# Patient Record
Sex: Female | Born: 1965
Health system: Southern US, Community
[De-identification: ages and names within clinical notes are randomized; demographics above are authoritative.]

## PROBLEM LIST (undated history)

## (undated) DIAGNOSIS — L989 Disorder of the skin and subcutaneous tissue, unspecified: Secondary | ICD-10-CM

## (undated) DIAGNOSIS — K219 Gastro-esophageal reflux disease without esophagitis: Secondary | ICD-10-CM

## (undated) DIAGNOSIS — N951 Menopausal and female climacteric states: Secondary | ICD-10-CM

## (undated) DIAGNOSIS — N393 Stress incontinence (female) (male): Secondary | ICD-10-CM

## (undated) DIAGNOSIS — Z820 Family history of epilepsy and other diseases of the nervous system: Secondary | ICD-10-CM

## (undated) DIAGNOSIS — M7540 Impingement syndrome of unspecified shoulder: Secondary | ICD-10-CM

## (undated) DIAGNOSIS — M222X9 Patellofemoral disorders, unspecified knee: Secondary | ICD-10-CM

## (undated) DIAGNOSIS — F4320 Adjustment disorder, unspecified: Secondary | ICD-10-CM

## (undated) DIAGNOSIS — L309 Dermatitis, unspecified: Secondary | ICD-10-CM

## (undated) HISTORY — DX: Dermatitis, unspecified: L30.9

## (undated) HISTORY — DX: Patellofemoral disorders, unspecified knee: M22.2X9

## (undated) HISTORY — PX: NASAL SINUS SURGERY: SHX719

## (undated) HISTORY — PX: KNEE SURGERY: SHX244

## (undated) HISTORY — PX: DILATION AND CURETTAGE OF UTERUS: SHX78

## (undated) HISTORY — DX: Adjustment disorder, unspecified: F43.20

## (undated) HISTORY — DX: Stress incontinence (female) (male): N39.3

## (undated) HISTORY — DX: Menopausal and female climacteric states: N95.1

## (undated) HISTORY — DX: Disorder of the skin and subcutaneous tissue, unspecified: L98.9

## (undated) HISTORY — DX: Impingement syndrome of unspecified shoulder: M75.40

## (undated) HISTORY — DX: Gastro-esophageal reflux disease without esophagitis: K21.9

## (undated) HISTORY — DX: Family history of epilepsy and other diseases of the nervous system: Z82.0

---

## 2002-08-11 DIAGNOSIS — M7540 Impingement syndrome of unspecified shoulder: Secondary | ICD-10-CM | POA: Insufficient documentation

## 2002-08-11 HISTORY — DX: Impingement syndrome of unspecified shoulder: M75.40

## 2003-09-28 DIAGNOSIS — L989 Disorder of the skin and subcutaneous tissue, unspecified: Secondary | ICD-10-CM

## 2003-09-28 HISTORY — DX: Disorder of the skin and subcutaneous tissue, unspecified: L98.9

## 2005-03-23 ENCOUNTER — Ambulatory Visit: Payer: Self-pay | Admitting: Family Medicine

## 2005-05-08 HISTORY — PX: ABDOMINAL HYSTERECTOMY: SHX81

## 2005-11-07 ENCOUNTER — Ambulatory Visit: Payer: Self-pay | Admitting: Unknown Physician Specialty

## 2006-01-04 ENCOUNTER — Inpatient Hospital Stay: Payer: Self-pay | Admitting: Otolaryngology

## 2006-01-18 ENCOUNTER — Ambulatory Visit: Payer: Self-pay | Admitting: Otolaryngology

## 2007-08-10 IMAGING — CT CT MAXILLOFACIAL WITHOUT CONTRAST
1 of 2 series · 15 of 30 positions shown, 19 images · non-contrast
Comparison: none

REASON FOR EXAM: Sinusitis brain abscess  Call  pager  [phone] or [phone]
OR
COMMENTS:

[Series 3: (person_name) series 1mm · axial · 0.29mm/px · z∈[+799,+951]mm · 15 of 168 slices shown, 19 images]
[im 8/168  brain]
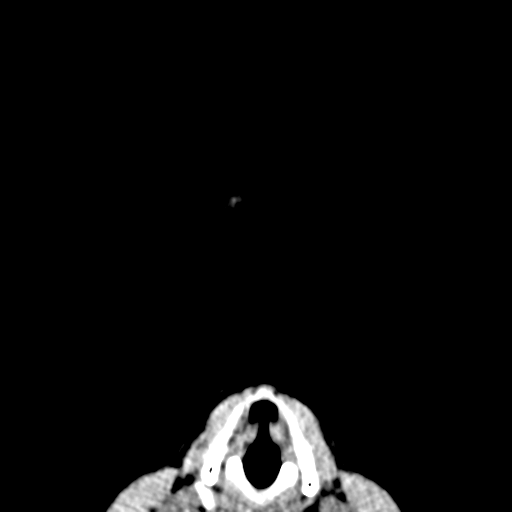
[im 8/168  bone]
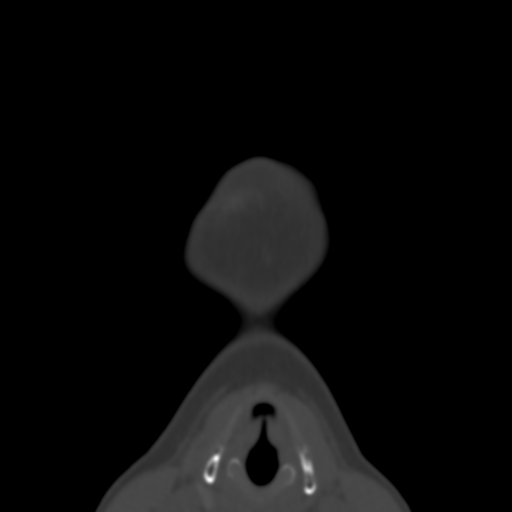
[im 23/168  bone]
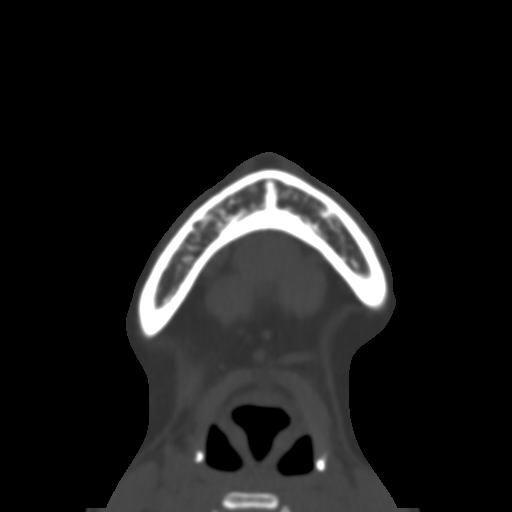
[im 31/168  bone]
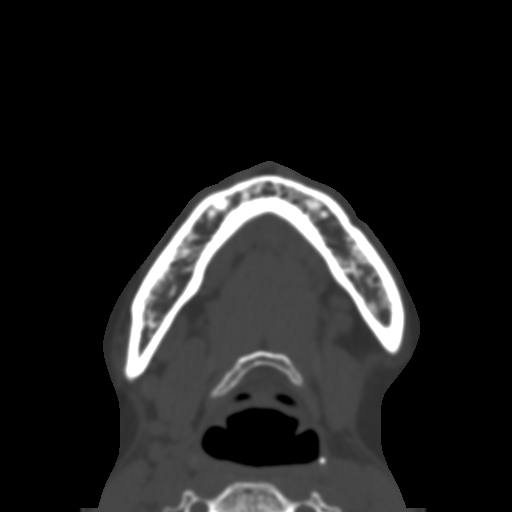
[im 38/168  bone]
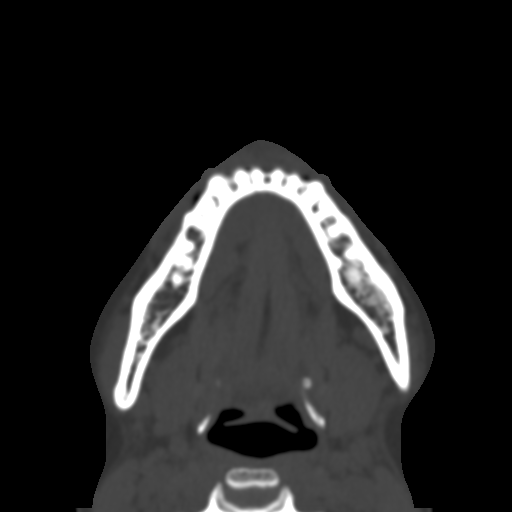
[im 54/168  brain]
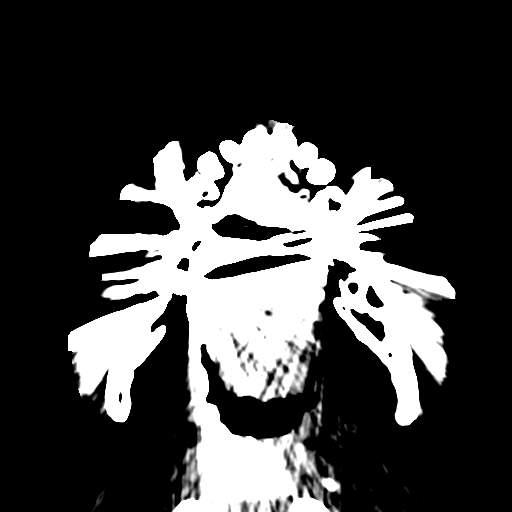
[im 54/168  bone]
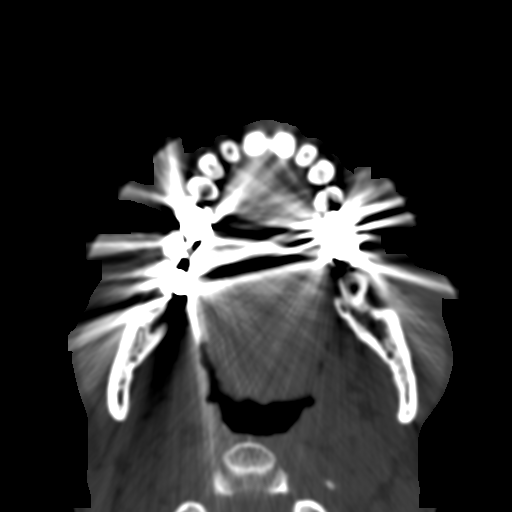
[im 61/168  bone]
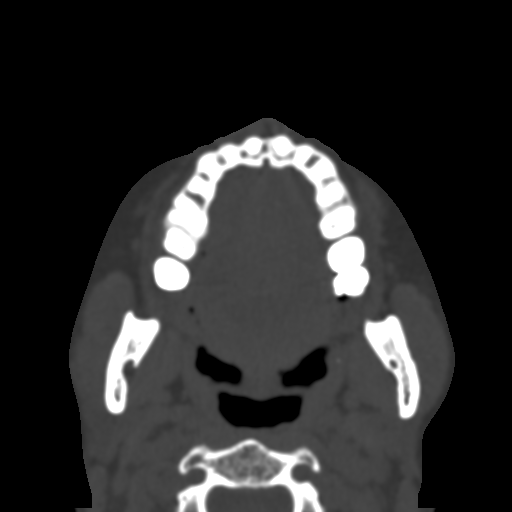
[im 76/168  bone]
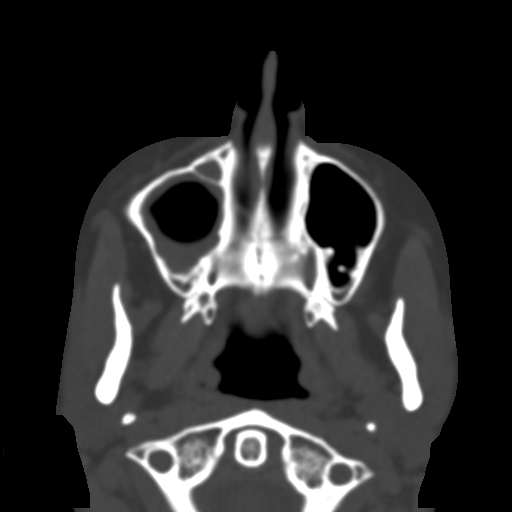
[im 84/168  bone]
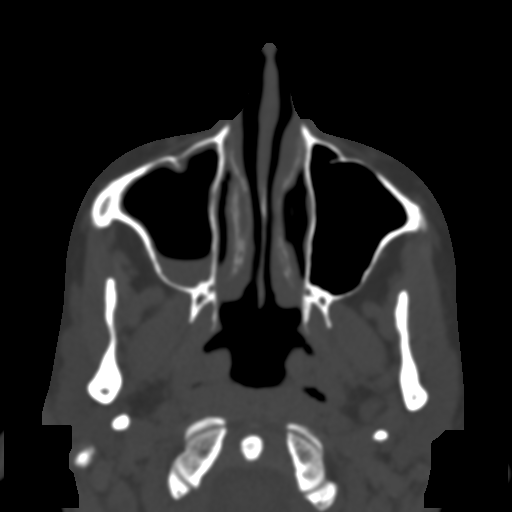
[im 92/168  brain]
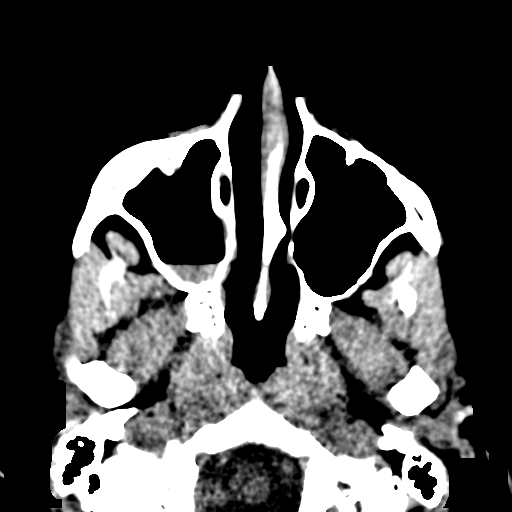
[im 92/168  bone]
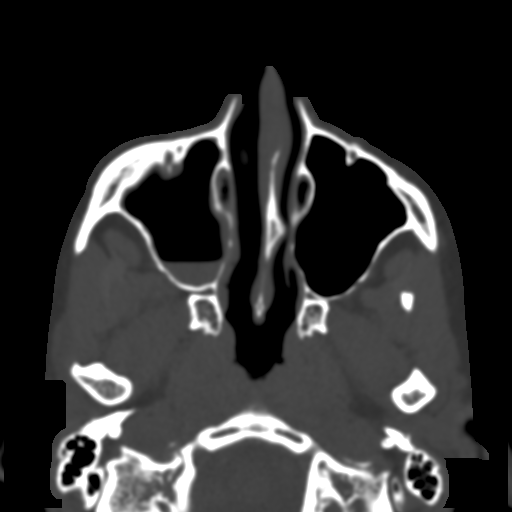
[im 107/168  bone]
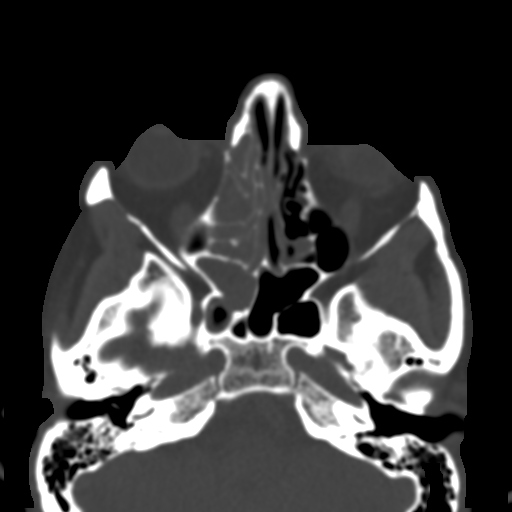
[im 114/168  bone]
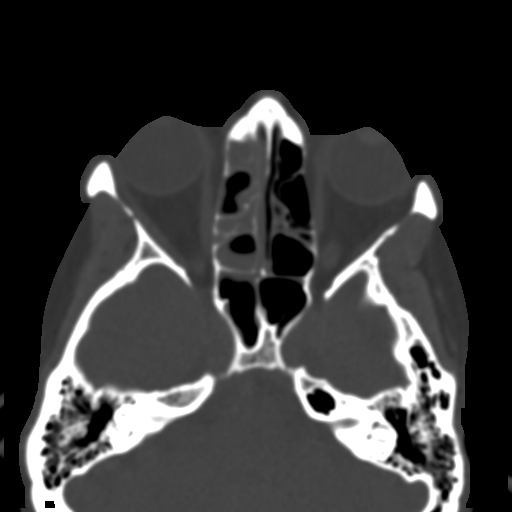
[im 130/168  bone]
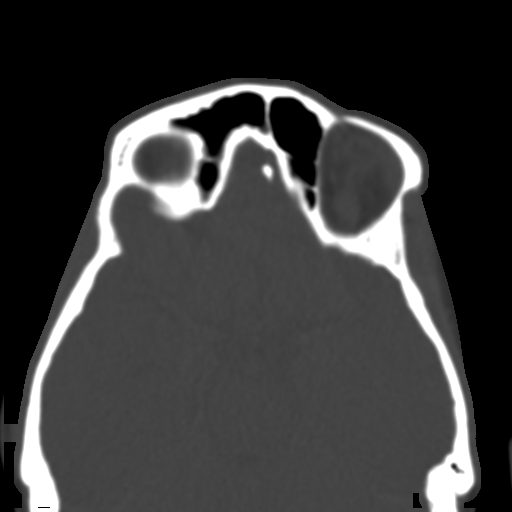
[im 137/168  brain]
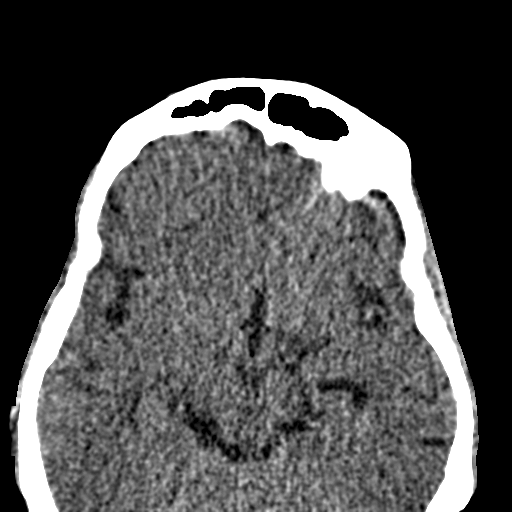
[im 137/168  bone]
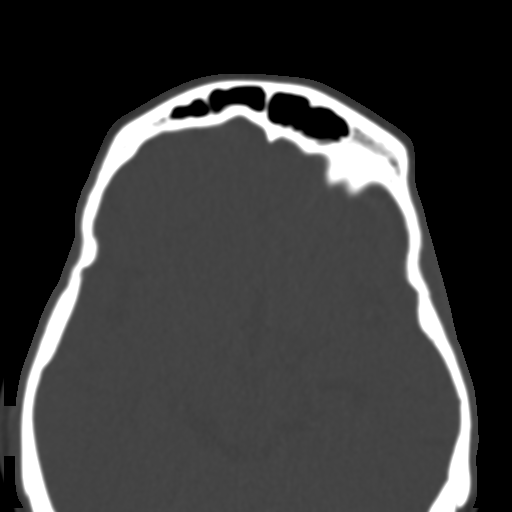
[im 145/168  bone]
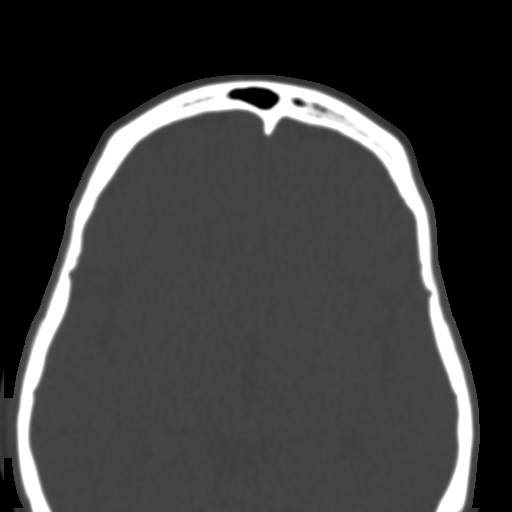
[im 160/168  bone]
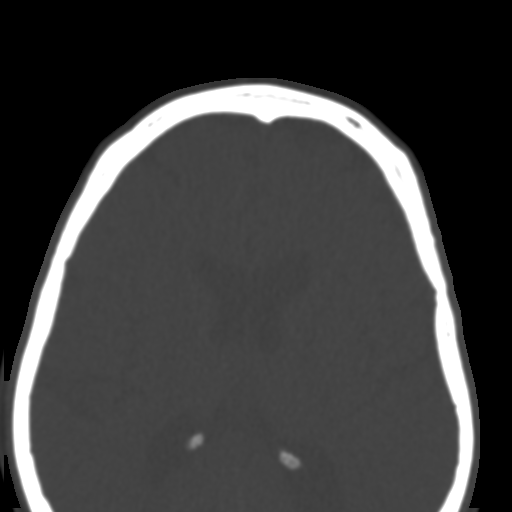

[15 of 30 positions shown; findings below may reference images not displayed]

PROCEDURE:     CT  - CT MAXILLOFACIAL (LONCONAO)  - January 04, 2006  [DATE]

RESULT:         An air-fluid level is demonstrated within the RIGHT
maxillary sinus as well as an element of mucoperiosteal thickening.  There
is near complete opacification of the ethmoid sinuses and near complete or
partial opacification of the sphenoid sinus.  These findings are appreciated
on the RIGHT.  The osseous structures appear to be intact without CT
evidence of erosions.  There is evidence of mucoperiosteal thickening within
the frontal sinuses on the RIGHT.  Occlusion of the ostia of the maxillary
sinus on the RIGHT is appreciated.  The mastoid air cells are patent.  Mild
mucoperiosteal thickening is identified involving the ethmoid air cells on
the LEFT.
IMPRESSION: Findings consistent with pansinusitis primarily on the RIGHT as described
above.  As stated above, the osseous structures appear to be intact.

## 2007-08-10 IMAGING — CT CT PARANASAL SINUSES W/O CM
1 series · 16 of 24 positions shown, 20 images · non-contrast
Comparison: none

REASON FOR EXAM: Sinusitis, brain abscess                            Call
report to: Pager# [phone] or [phone] OR
COMMENTS:

[Series 2: sinus · axial · 0.27mm/px · z∈[+833,+938]mm · 16 of 24 slices shown, 20 images]
[im 2/24  brain]
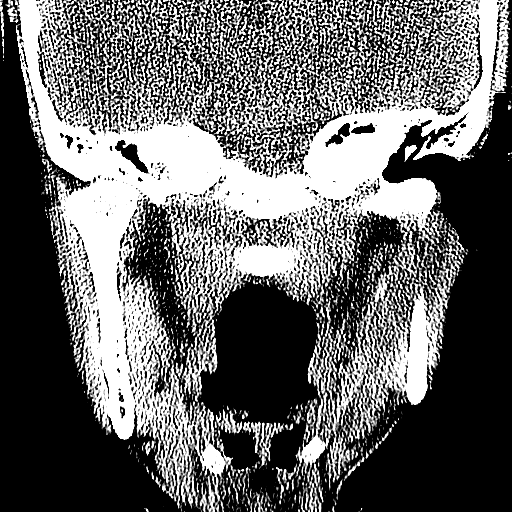
[im 2/24  bone]
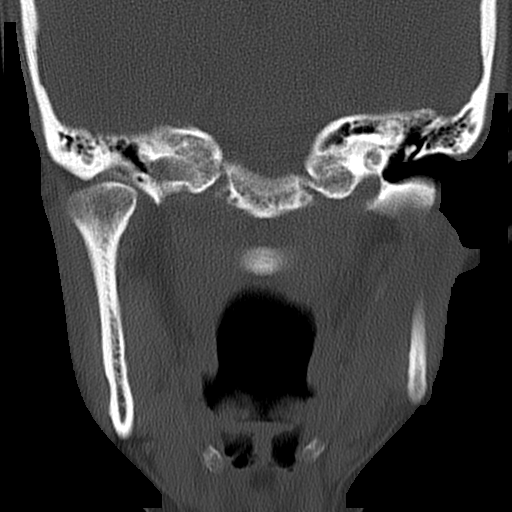
[im 4/24  bone]
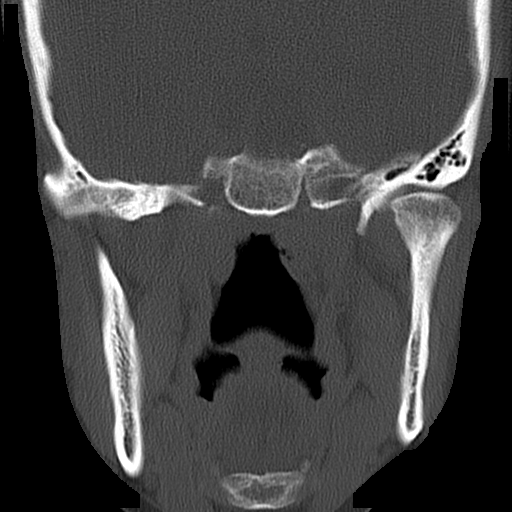
[im 5/24  bone]
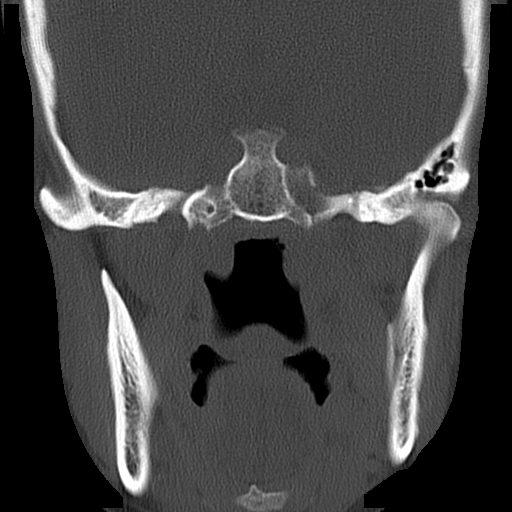
[im 6/24  bone]
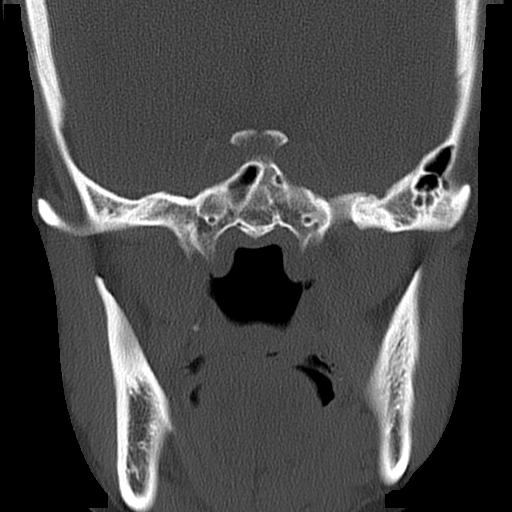
[im 8/24  brain]
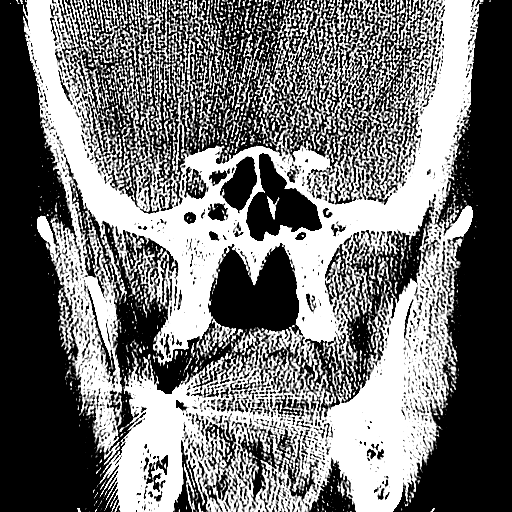
[im 8/24  bone]
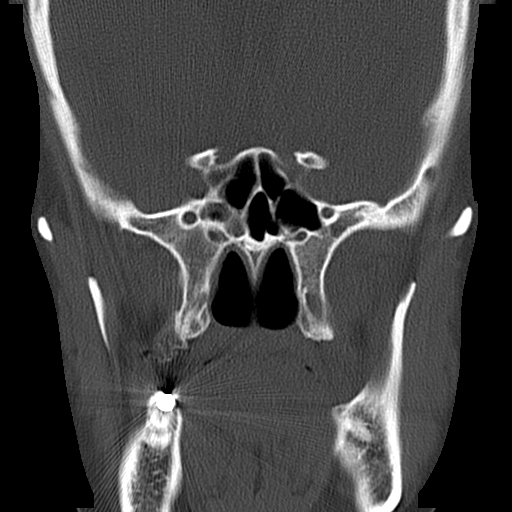
[im 9/24  bone]
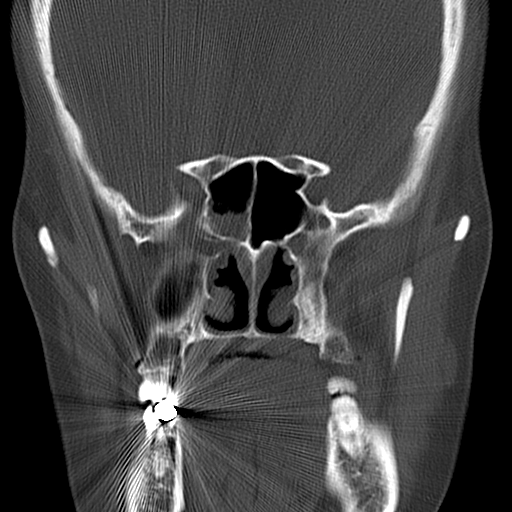
[im 10/24  bone]
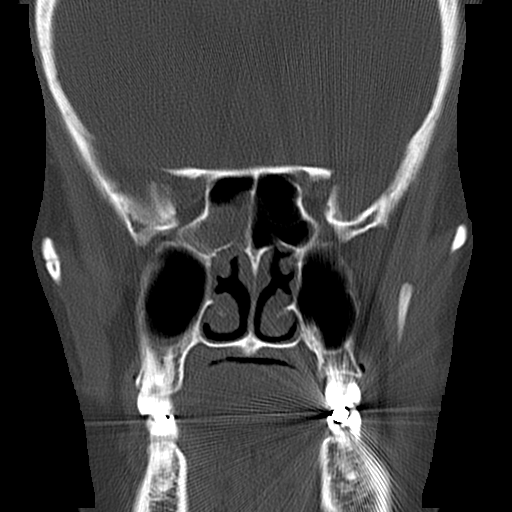
[im 12/24  bone]
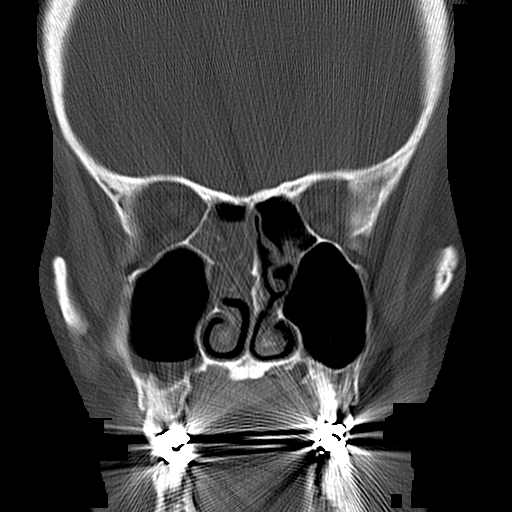
[im 13/24  brain]
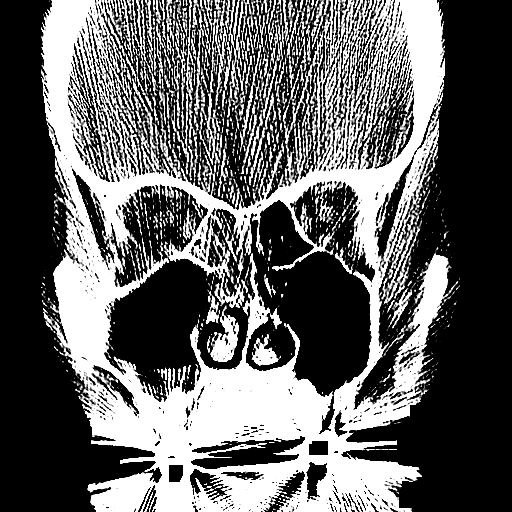
[im 13/24  bone]
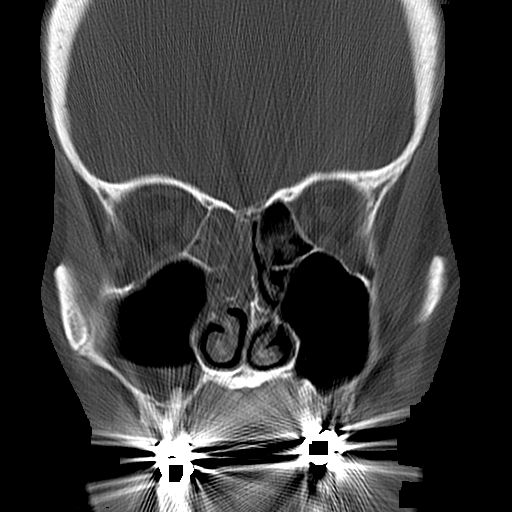
[im 15/24  bone]
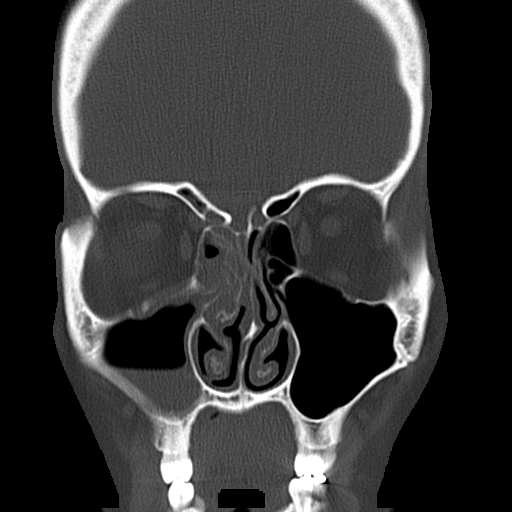
[im 16/24  bone]
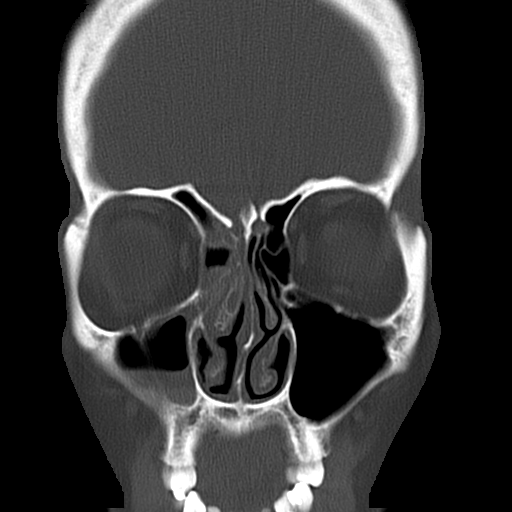
[im 17/24  bone]
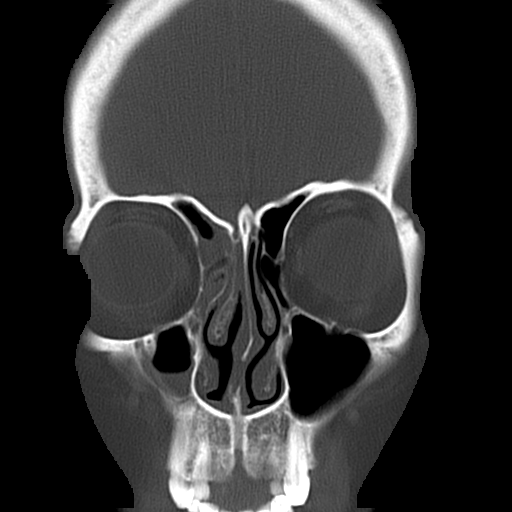
[im 19/24  brain]
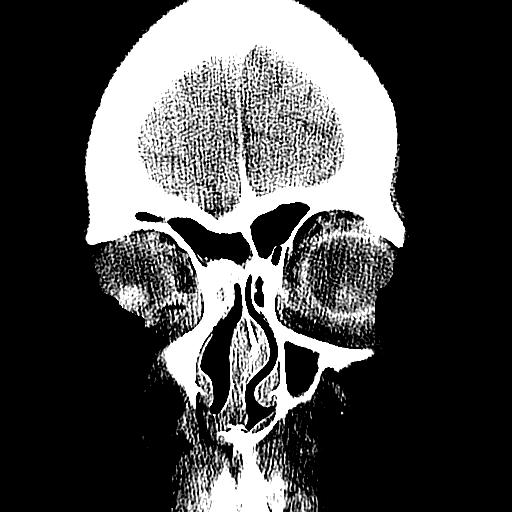
[im 19/24  bone]
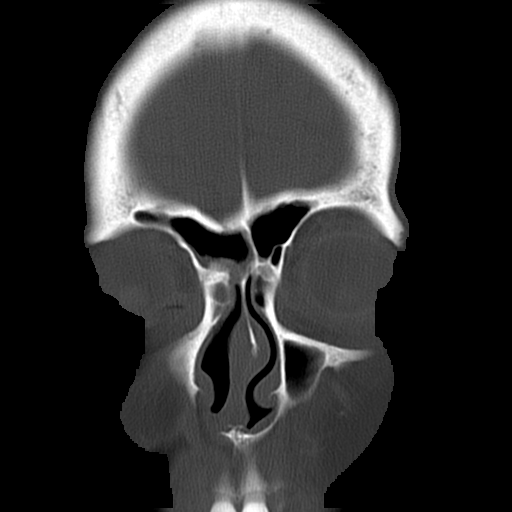
[im 20/24  bone]
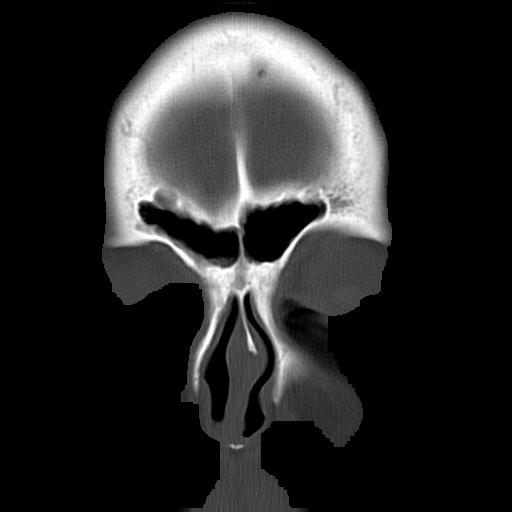
[im 21/24  bone]
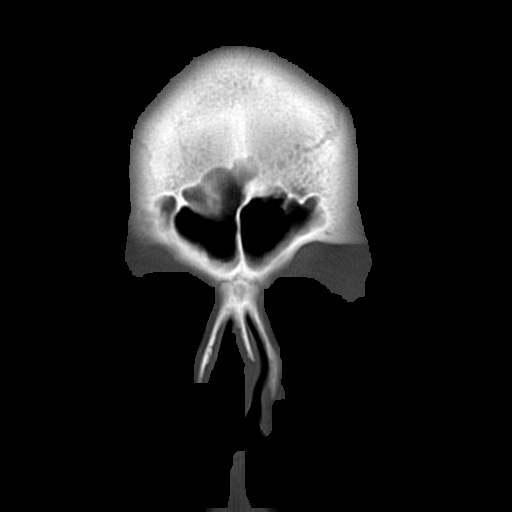
[im 23/24  bone]
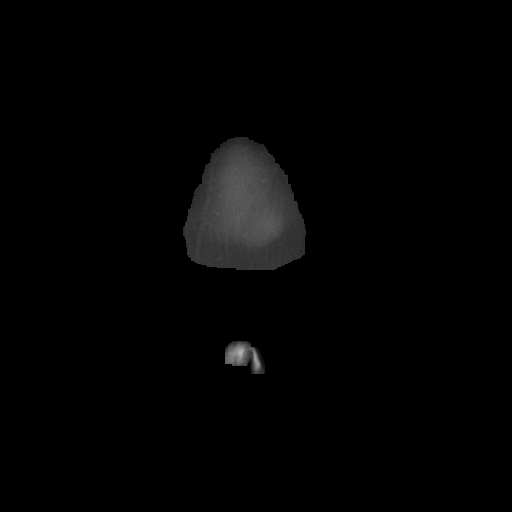

[16 of 24 positions shown; findings below may reference images not displayed]

PROCEDURE:     CT  - CT SINUSES WITHOUT CONTRAST  - January 04, 2006  [DATE]

RESULT:     Coronal images were obtained through the paranasal sinuses.

An air/fluid level as well as mucoperiosteal thickening is identified within
the RIGHT maxillary sinus. There is opacification of the ethmoid air cells
as well as mucoperiosteal thickening within the RIGHT frontal sinus. An
air/fluid level with mucoperiosteal thickening and near complete
opacification is demonstrated within the sphenoid sinuses on the RIGHT.
There is occlusion of the ostia of the maxillary sinus on the RIGHT.
Evaluation on the LEFT demonstrates an element of mucoperiosteal thickening
involving the sphenoid sinus. The remaining paranasal sinuses appear to be
well aerated.
IMPRESSION: Findings consistent with pansinusitis primarily on the
RIGHT as described above.

## 2007-10-01 DIAGNOSIS — D239 Other benign neoplasm of skin, unspecified: Secondary | ICD-10-CM

## 2007-10-01 HISTORY — DX: Other benign neoplasm of skin, unspecified: D23.9

## 2007-10-04 LAB — HM PAP SMEAR: HM PAP: NEGATIVE

## 2008-04-09 ENCOUNTER — Ambulatory Visit: Payer: Self-pay | Admitting: Family Medicine

## 2008-04-10 ENCOUNTER — Ambulatory Visit: Payer: Self-pay | Admitting: Family Medicine

## 2009-01-05 DIAGNOSIS — N393 Stress incontinence (female) (male): Secondary | ICD-10-CM

## 2009-01-05 HISTORY — DX: Stress incontinence (female) (male): N39.3

## 2010-04-25 DIAGNOSIS — M222X9 Patellofemoral disorders, unspecified knee: Secondary | ICD-10-CM

## 2010-04-25 HISTORY — DX: Patellofemoral disorders, unspecified knee: M22.2X9

## 2011-01-06 ENCOUNTER — Ambulatory Visit: Payer: Self-pay | Admitting: Unknown Physician Specialty

## 2011-01-06 LAB — HM COLONOSCOPY

## 2011-04-20 ENCOUNTER — Ambulatory Visit: Payer: Self-pay | Admitting: Specialist

## 2011-05-03 ENCOUNTER — Ambulatory Visit: Payer: Self-pay | Admitting: Specialist

## 2011-07-26 ENCOUNTER — Encounter: Payer: Self-pay | Admitting: Specialist

## 2011-08-07 ENCOUNTER — Encounter: Payer: Self-pay | Admitting: Specialist

## 2011-09-06 ENCOUNTER — Encounter: Payer: Self-pay | Admitting: Specialist

## 2012-11-29 ENCOUNTER — Ambulatory Visit: Payer: Self-pay | Admitting: Family Medicine

## 2012-11-29 LAB — HM MAMMOGRAPHY

## 2015-03-15 ENCOUNTER — Other Ambulatory Visit: Payer: Self-pay

## 2015-03-15 DIAGNOSIS — N951 Menopausal and female climacteric states: Secondary | ICD-10-CM | POA: Insufficient documentation

## 2015-03-15 DIAGNOSIS — K219 Gastro-esophageal reflux disease without esophagitis: Secondary | ICD-10-CM

## 2015-03-15 DIAGNOSIS — L309 Dermatitis, unspecified: Secondary | ICD-10-CM

## 2015-03-15 DIAGNOSIS — Z820 Family history of epilepsy and other diseases of the nervous system: Secondary | ICD-10-CM

## 2015-03-15 DIAGNOSIS — F4320 Adjustment disorder, unspecified: Secondary | ICD-10-CM | POA: Insufficient documentation

## 2015-03-15 DIAGNOSIS — F419 Anxiety disorder, unspecified: Secondary | ICD-10-CM | POA: Insufficient documentation

## 2015-03-15 DIAGNOSIS — F4321 Adjustment disorder with depressed mood: Secondary | ICD-10-CM

## 2015-03-15 HISTORY — DX: Dermatitis, unspecified: L30.9

## 2015-03-15 HISTORY — DX: Adjustment disorder, unspecified: F43.20

## 2015-03-15 HISTORY — DX: Gastro-esophageal reflux disease without esophagitis: K21.9

## 2015-03-15 HISTORY — DX: Family history of epilepsy and other diseases of the nervous system: Z82.0

## 2015-03-15 HISTORY — DX: Menopausal and female climacteric states: N95.1

## 2015-03-15 MED ORDER — SERTRALINE HCL 100 MG PO TABS: 100.0000 mg | ORAL_TABLET | Freq: Every day | ORAL | Status: DC

## 2015-03-15 NOTE — Telephone Encounter (Signed)
Patient requesting refill of 90 day supply.

## 2015-03-16 ENCOUNTER — Other Ambulatory Visit: Payer: Self-pay

## 2015-03-16 DIAGNOSIS — F4321 Adjustment disorder with depressed mood: Secondary | ICD-10-CM

## 2015-03-16 MED ORDER — ZOLOFT 100 MG PO TABS: 100.0000 mg | ORAL_TABLET | Freq: Every day | ORAL | Status: DC

## 2015-04-23 ENCOUNTER — Ambulatory Visit (INDEPENDENT_AMBULATORY_CARE_PROVIDER_SITE_OTHER): Payer: 59 | Admitting: Family Medicine

## 2015-04-23 ENCOUNTER — Encounter: Payer: Self-pay | Admitting: Family Medicine

## 2015-04-23 VITALS — BP 100/66 | HR 72 | Temp 97.7°F | Resp 16 | Ht 65.0 in | Wt 126.0 lb

## 2015-04-23 DIAGNOSIS — E785 Hyperlipidemia, unspecified: Secondary | ICD-10-CM | POA: Diagnosis not present

## 2015-04-23 DIAGNOSIS — Z0000 Encounter for general adult medical examination without abnormal findings: Secondary | ICD-10-CM

## 2015-04-23 DIAGNOSIS — Z23 Encounter for immunization: Secondary | ICD-10-CM

## 2015-04-23 DIAGNOSIS — N393 Stress incontinence (female) (male): Secondary | ICD-10-CM | POA: Diagnosis not present

## 2015-04-23 DIAGNOSIS — Z1239 Encounter for other screening for malignant neoplasm of breast: Secondary | ICD-10-CM | POA: Diagnosis not present

## 2015-04-23 DIAGNOSIS — F4329 Adjustment disorder with other symptoms: Secondary | ICD-10-CM | POA: Diagnosis not present

## 2015-04-23 LAB — POCT URINALYSIS DIPSTICK
Bilirubin, UA: NEGATIVE
Glucose, UA: NEGATIVE
Ketones, UA: NEGATIVE
Leukocytes, UA: NEGATIVE
Nitrite, UA: NEGATIVE
PROTEIN UA: NEGATIVE
RBC UA: NEGATIVE
SPEC GRAV UA: 1.01
UROBILINOGEN UA: 0.2
pH, UA: 6

## 2015-04-23 NOTE — Progress Notes (Signed)
Patient ID: Alyssa Humphrey, female   DOB: [birth year], 49 y.o.   MRN: [id number]       Patient: Alyssa Humphrey, Female    DOB: March 31, 1966, 49 y.o.   MRN: [id number] Visit Date: 04/23/2015  Today's Provider: Margarita Rana, MD   Chief Complaint  Patient presents with  . Annual Exam   Subjective:    Annual physical exam Alyssa Humphrey is a 49 y.o. female who presents today for health maintenance and complete physical. She feels well. She reports exercising once a week, but stays very active with daily activites. She reports she is sleeping fairly well. Has no concerns today except for worsening urinary incontinence.   05/14/12 CPE 10/04/07 Pap-neg 11/28/12 Mammo-BI-RADS 2 01/06/11 Colon-WNL   -----------------------------------------------------------------   Review of Systems  Constitutional: Positive for fatigue.  HENT: Positive for sore throat.   Eyes: Negative.   Respiratory: Negative.   Cardiovascular: Negative.   Gastrointestinal: Negative.   Endocrine: Negative.   Genitourinary: Positive for enuresis.  Musculoskeletal: Negative.   Skin: Negative.   Allergic/Immunologic: Negative.   Neurological: Positive for headaches.  Hematological: Negative.   Psychiatric/Behavioral: Negative.     Social History      She  reports that she has never smoked. She has never used smokeless tobacco. She reports that she drinks about 1.2 oz of alcohol per week. She reports that she does not use illicit drugs.       Social History   Social History  . Marital Status: Married    Spouse Name: N/A  . Number of Children: N/A  . Years of Education: N/A   Social History Main Topics  . Smoking status: Never Smoker   . Smokeless tobacco: Never Used  . Alcohol Use: 1.2 oz/week    2 Glasses of wine per week  . Drug Use: No  . Sexual Activity: Not Asked   Other Topics Concern  . None   Social History Narrative    Patient Active Problem List   Diagnosis Date Noted  .  Adaptation reaction 03/15/2015  . Dermatitis, eczematoid 03/15/2015  . Family history of Alzheimer's disease 03/15/2015  . Acid reflux 03/15/2015  . Post menopausal syndrome 03/15/2015  . Patella-femoral syndrome 04/25/2010  . Stress incontinence 01/05/2009  . Skin lesion 09/28/2003  . Impingement syndrome of shoulder 08/11/2002    Past Surgical History  Procedure Laterality Date  . Cesarean section  04/26/92; 01/15/96; 12/18/99  . Nasal sinus surgery    . Knee surgery Left   . Dilation and curettage of uterus      x's 2  . Abdominal hysterectomy  2007    Family History        Family Status  Relation Status Death Age  . Mother Alive   . Father Deceased 69  . Sister Alive   . Sister Alive         Her family history includes Dementia in her mother; Healthy in her sister and sister; Hypertension in her father and mother; Osteoarthritis in her father and mother.    No Known Allergies  Previous Medications   ESTROGENS, CONJUGATED, (PREMARIN) 1.25 MG TABLET    Take 2 tablets by mouth daily.   LOTEMAX 0.5 % GEL       PHOSPHATIDYLSERINE-DHA-EPA (VAYACOG) 100-19.5-6.5 MG CAPS    Take 1 capsule by mouth daily.   ZOLOFT 100 MG TABLET    Take 1 tablet (100 mg total) by mouth daily.    Patient Care Team: Izora Gala  Venia Minks, MD as PCP - General (Family Medicine)     Objective:   Vitals: BP 100/66 mmHg  Pulse 72  Temp(Src) 97.7 F (36.5 C) (Oral)  Resp 16  Ht 5\' 5"  (1.651 m)  Wt 126 lb (57.153 kg)  BMI 20.97 kg/m2  SpO2 98%   Physical Exam  Constitutional: She is oriented to person, place, and time. She appears well-developed and well-nourished.  HENT:  Head: Normocephalic and atraumatic.  Right Ear: Tympanic membrane, external ear and ear canal normal.  Left Ear: Tympanic membrane, external ear and ear canal normal.  Nose: Nose normal.  Mouth/Throat: Uvula is midline, oropharynx is clear and moist and mucous membranes are normal.  Eyes: Conjunctivae, EOM and lids are  normal. Pupils are equal, round, and reactive to light.  Neck: Trachea normal and normal range of motion. Neck supple. Carotid bruit is not present. No thyroid mass and no thyromegaly present.  Cardiovascular: Normal rate, regular rhythm and normal heart sounds.   Pulmonary/Chest: Effort normal and breath sounds normal.  Abdominal: Soft. Normal appearance and bowel sounds are normal. There is no hepatosplenomegaly. There is no tenderness.  Genitourinary: No breast swelling, tenderness or discharge.  Musculoskeletal: Normal range of motion.  Lymphadenopathy:    She has no cervical adenopathy.    She has no axillary adenopathy.  Neurological: She is alert and oriented to person, place, and time. She has normal strength. No cranial nerve deficit.  Skin: Skin is warm, dry and intact.  Psychiatric: She has a normal mood and affect. Her speech is normal and behavior is normal. Judgment and thought content normal. Cognition and memory are normal.    Assessment & Plan:     Routine Health Maintenance and Physical Exam  Exercise Activities and Dietary recommendations Goals    . Exercise 150 minutes per week (moderate activity)       Immunization History  Administered Date(s) Administered  . Influenza,inj,Quad PF,36+ Mos 04/23/2015   1. Annual physical exam Stable.   - POCT urinalysis dipstick Results for orders placed or performed in visit on 04/23/15  POCT urinalysis dipstick  Result Value Ref Range   Color, UA yellow    Clarity, UA clear    Glucose, UA neg    Bilirubin, UA neg    Ketones, UA neg    Spec Grav, UA 1.010    Blood, UA neg    pH, UA 6.0    Protein, UA neg    Urobilinogen, UA 0.2    Nitrite, UA neg    Leukocytes, UA Negative Negative    2. Need for influenza vaccination Given today.  - Flu Vaccine QUAD 36+ mos IM  3. Breast cancer screening Will call and schedule.  - MM DIGITAL SCREENING BILATERAL; Future  4. Hyperlipidemia Stable. Will check labs.   -  CBC with Differential/Platelet - Comprehensive metabolic panel - Lipid Panel With LDL/HDL Ratio  5. Adjustment disorder with other symptom Stable. Continue current medication.   - TSH  6. Stress incontinence Will refer.  - Ambulatory referral to Urology   Patient was seen and examined by Jerrell Belfast, MD, and note scribed by Lynford Humphrey, [place].   I have reviewed the document for accuracy and completeness and I agree with above. Jerrell Belfast, MD   Margarita Rana, MD    --------------------------------------------------------------------

## 2015-04-23 NOTE — Patient Instructions (Signed)
Please call the [facility] at [facility] to schedule this at [phone]   

## 2015-05-26 ENCOUNTER — Ambulatory Visit (INDEPENDENT_AMBULATORY_CARE_PROVIDER_SITE_OTHER): Payer: 59 | Admitting: Urology

## 2015-05-26 ENCOUNTER — Encounter: Payer: Self-pay | Admitting: Urology

## 2015-05-26 VITALS — BP 99/64 | HR 55 | Ht 65.0 in | Wt 126.6 lb

## 2015-05-26 DIAGNOSIS — N393 Stress incontinence (female) (male): Secondary | ICD-10-CM

## 2015-05-26 LAB — BLADDER SCAN AMB NON-IMAGING: Scan Result: 82

## 2015-05-26 LAB — URINALYSIS, COMPLETE
Bilirubin, UA: NEGATIVE
GLUCOSE, UA: NEGATIVE
KETONES UA: NEGATIVE
LEUKOCYTES UA: NEGATIVE
Nitrite, UA: NEGATIVE
Protein, UA: NEGATIVE
RBC, UA: NEGATIVE
SPEC GRAV UA: 1.02 (ref 1.005–1.030)
Urobilinogen, Ur: 0.2 mg/dL (ref 0.2–1.0)
pH, UA: 7 (ref 5.0–7.5)

## 2015-05-26 LAB — MICROSCOPIC EXAMINATION
Bacteria, UA: NONE SEEN
Epithelial Cells (non renal): NONE SEEN /hpf (ref 0–10)
RBC MICROSCOPIC, UA: NONE SEEN /HPF (ref 0–?)
WBC, UA: NONE SEEN /hpf (ref 0–?)

## 2015-05-26 NOTE — Progress Notes (Signed)
Bladder Scan Patient void: 82 ml Performed By: Larna Daughters

## 2015-05-26 NOTE — Progress Notes (Signed)
05/26/2015 1:45 PM   Alyssa Humphrey 06/16/1965 [id number]  Referring provider: Margarita Rana, MD [address] [place], [place] [address]  Chief Complaint  Patient presents with  . New Patient (Initial Visit)    stress incontinence     HPI: 50 year old female who presents today for evaluation of urinary incontinence.  She has issues with leaking primarily when she laughs, coughs, sneezes. This is being going on for at least 5+.  It is becoming quite bothersome to her and affecting her quality of life. She has had to limit her physical activity due to concern for wetting herself.  She wears  2 ppd but not saturated depending on activity level.    She denies any other urinary symptoms including recurrent urinary tract infections, dysuria, gross hematuria, urinary urgency, frequency, or urge incontinence.  She has seen Dr. Gaynelle Arabian back in 2010 for the same issue at which time she underwent physical therapy. She does do Kegel exercises on a regular basis. She is not overweight.  She is status post laparoscopic abdominal hysterectomy.  She's had a C-section 2.  She denies any dyspareunia, vaginal bulging, or any other symptoms related to pelvic organ prolapse.  She denies any constipation or difficulty evacuating her stool.   PMH: Past Medical History  Diagnosis Date  . Acid reflux 03/15/2015  . Dermatitis, eczematoid 03/15/2015    hand   . Patella-femoral syndrome 04/25/2010    treated by Dr Cipriano Mile   . Skin lesion 09/28/2003    Lesion on right chin removed by Dr Debe Coder Clarcke-Compound nevus   . Adaptation reaction 03/15/2015  . Family history of Alzheimer's disease 03/15/2015  . Impingement syndrome of shoulder 08/11/2002    Injected per Ortho   . Post menopausal syndrome 03/15/2015  . Stress incontinence 01/05/2009    Treated by Dr. Arlyn Leak     Surgical History: Past Surgical History  Procedure Laterality Date  . Cesarean section  04/26/92; 01/15/96;  12/18/99  . Nasal sinus surgery    . Knee surgery Left   . Dilation and curettage of uterus      x's 2  . Abdominal hysterectomy  2007    Home Medications:    Medication List       This list is accurate as of: 05/26/15  1:45 PM.  Always use your most recent med list.               LOTEMAX 0.5 % Gel  Generic drug:  Loteprednol Etabonate     PREMARIN 1.25 MG tablet  Generic drug:  estrogens (conjugated)  Take 2 tablets by mouth daily. Reported on 05/26/2015     VAYACOG 100-19.5-6.5 MG Caps  Generic drug:  Phosphatidylserine-DHA-EPA  Take 1 capsule by mouth daily.     ZOLOFT 100 MG tablet  Generic drug:  sertraline  Take 1 tablet (100 mg total) by mouth daily.        Allergies: No Known Allergies  Family History: Family History  Problem Relation Age of Onset  . Dementia Mother   . Osteoarthritis Mother   . Hypertension Mother   . Hypertension Father   . Osteoarthritis Father   . Healthy Sister   . Healthy Sister   . Kidney cancer Neg Hx     Social History:  reports that she has never smoked. She has never used smokeless tobacco. She reports that she drinks about 1.2 oz of alcohol per week. She reports that she does not use  illicit drugs.  ROS: UROLOGY Frequent Urination?: No Hard to postpone urination?: Yes Burning/pain with urination?: No Get up at night to urinate?: No Leakage of urine?: Yes Urine stream starts and stops?: Yes Trouble starting stream?: No Do you have to strain to urinate?: No Blood in urine?: No Urinary tract infection?: No Sexually transmitted disease?: No Injury to kidneys or bladder?: No Painful intercourse?: No Weak stream?: No Currently pregnant?: No Vaginal bleeding?: No Last menstrual period?: hysterectomy  Gastrointestinal Nausea?: No Vomiting?: No Indigestion/heartburn?: No Diarrhea?: No Constipation?: No  Constitutional Fever: No Night sweats?: No Weight loss?: No Fatigue?: Yes  Skin Skin rash/lesions?:  No Itching?: No  Eyes Blurred vision?: No Double vision?: No  Ears/Nose/Throat Sore throat?: No Sinus problems?: No  Hematologic/Lymphatic Swollen glands?: No Easy bruising?: No  Cardiovascular Leg swelling?: No Chest pain?: No  Respiratory Cough?: No Shortness of breath?: No  Endocrine Excessive thirst?: No  Musculoskeletal Back pain?: Yes Joint pain?: No  Neurological Headaches?: No Dizziness?: No  Psychologic Depression?: No Anxiety?: No  Physical Exam: BP 99/64 mmHg  Pulse 55  Ht 5\' 5"  (1.651 m)  Wt 126 lb 9.6 oz (57.425 kg)  BMI 21.07 kg/m2  Constitutional:  Alert and oriented, No acute distress. HEENT: [place] AT, moist mucus membranes.  Trachea midline, no masses. Cardiovascular: No clubbing, cyanosis, or edema. Respiratory: Normal respiratory effort, no increased work of breathing. Skin: No rashes, bruises or suspicious lesions. Neurologic: Grossly intact, no focal deficits, moving all 4 extremities. Psychiatric: Normal mood and affect.  Urinalysis UA reviewed, negative.  See epic.  Pertinent Imaging: Results for orders placed or performed in visit on 05/26/15  Bladder Scan (Post Void Residual) in office  Result Value Ref Range   Scan Result 82     Assessment & Plan: 50 year old female with isolated distress urinary incontinence. This is severely impacting the quality of her life.  Her symptoms are refractory to pelvic floor therapy.    1. Stress incontinence  We discussed various options day in terms of treatment for her refractory stress incontinence. She is not obese and therefore this is not contributing to her symptoms. She does perform pelvic floor exercises on a regular basis and has been taught by physical therapy in the past. Other options including imipramine, mid urethral sling placement, and urethral bulking agents were discussed at length today. The controversy over the FDA warning about the use of vaginal mesh was also discussed at  length. We also discussed further workup possibly with urodynamics. No evidence of incomplete bladder emptying today.  She is leaning towards proceeding with surgery. Her sister had the same procedure and is very pleased with the result. We discussed the surgery itself and what it entails along with the typical recovery period.  We discussed possible complications including vaginal mesh infection, extrusion, erosion, and retention.   All of her questions were answered. He was interested in seeing Dr. Matilde Sprang for further consultation and surgical planning (pelvic exam deferred today as she will be likely booking surgery with him).  - Urinalysis, Complete - Bladder Scan (Post Void Residual) in office   Return for with Dr. Suzanne Boron Diarmid .  Alyssa Espy, MD  [facility] [address], [place],  [address] [phone]   I spent 30 min with this patient of which greater than 50% was spent in counseling and coordination of care with the patient. Discussion was lengthy and all of her questions were answered.

## 2015-05-27 ENCOUNTER — Telehealth: Payer: Self-pay | Admitting: Urology

## 2015-05-27 NOTE — Telephone Encounter (Signed)
Patient has been schd for UDS on 06-30-15 @ 9:45 and she will come back on 07-16-15 @ 9:00 for results here with Dr. Matilde Sprang. [facility] and called and spoke to the patient and given her the appointment information. She said she knew where to go in [place] but i wrote everything down and took it over and gave it to her husband to give to her.   Thanks,  Sharyn Lull

## 2015-06-30 DIAGNOSIS — R32 Unspecified urinary incontinence: Secondary | ICD-10-CM | POA: Diagnosis not present

## 2015-06-30 DIAGNOSIS — Z0000 Encounter for general adult medical examination without abnormal findings: Secondary | ICD-10-CM | POA: Diagnosis not present

## 2015-07-14 ENCOUNTER — Other Ambulatory Visit: Payer: Self-pay | Admitting: Urology

## 2015-07-16 ENCOUNTER — Encounter: Payer: Self-pay | Admitting: Urology

## 2015-07-16 ENCOUNTER — Ambulatory Visit (INDEPENDENT_AMBULATORY_CARE_PROVIDER_SITE_OTHER): Payer: 59 | Admitting: Urology

## 2015-07-16 VITALS — BP 97/61 | HR 62 | Ht 65.0 in | Wt 125.2 lb

## 2015-07-16 DIAGNOSIS — N393 Stress incontinence (female) (male): Secondary | ICD-10-CM | POA: Diagnosis not present

## 2015-07-16 NOTE — Progress Notes (Signed)
07/16/2015 9:36 AM   [name] 12-May-1965 [id number]  Referring provider: Margarita Rana, MD [address] [place], [place] [address]  Chief Complaint  Patient presents with  . Follow-up    urodynamic results     HPI: Dr Erlene Quan: 50 year old female who presents today for evaluation of urinary incontinence. She has issues with leaking primarily when she laughs, coughs, sneezes. This is being going on for at least 5+. It is becoming quite bothersome to her and affecting her quality of life. She has had to limit her physical activity due to concern for wetting herself. She wears 2 ppd but not saturated depending on activity level.  She has seen Dr. Gaynelle Arabian back in 2010 for the same issue at which time she underwent physical therapy. She does do Kegel exercises on a regular basis. She is not overweight. She is status post laparoscopic abdominal hysterectomy.  She denies any dyspareunia, vaginal bulging, or any other symptoms related to pelvic organ prolapse.  On further history the patient leaks with coughing sneezing but using not with bending or lifting. She can soak a pad if she runs or does exercise. If she wakes too long she has some urgency incontinence. She denies enuresis. She wears 1 pad a day markedly wet. She voids every 2 hours is no nocturia  She has not had previous GU surgery and she has no neurologic risk factors or symptoms. Bowel movements are normal  On urodynamics her maximum assist with capacity was 287 mL. Her bladder was unstable reaching pressures of 7 cm of water and she did not leak. At 200 mL her Valsalva leak point pressure 65 cm water and her leakage was mild. When she coughed the leakage was moderate during voluntary voiding she voided 280 mL with a maximum flow of 33 mils per second. Maximum voiding pressures 25 cm water. She emptied efficiently  EMG activity increased mildly during voiding. Ladder neck dissented 2 cm. She was doing  some triggering of overactivity when she would cough she said she normally does not hold that degree of a bladder volume. Her flow pattern on free uroflow was normal. The details of her urodynamics were signed and dictated on the urodynamic sheet   PMH: Past Medical History  Diagnosis Date  . Acid reflux 03/15/2015  . Dermatitis, eczematoid 03/15/2015    hand   . Patella-femoral syndrome 04/25/2010    treated by Dr Cipriano Mile   . Skin lesion 09/28/2003    Lesion on right chin removed by Dr Debe Coder Clarcke-Compound nevus   . Adaptation reaction 03/15/2015  . Family history of Alzheimer's disease 03/15/2015  . Impingement syndrome of shoulder 08/11/2002    Injected per Ortho   . Post menopausal syndrome 03/15/2015  . Stress incontinence 01/05/2009    Treated by Dr. Arlyn Leak     Surgical History: Past Surgical History  Procedure Laterality Date  . Cesarean section  04/26/92; 01/15/96; 12/18/99  . Nasal sinus surgery    . Knee surgery Left   . Dilation and curettage of uterus      x's 2  . Abdominal hysterectomy  2007    Home Medications:    Medication List       This list is accurate as of: 07/16/15  9:36 AM.  Always use your most recent med list.               LOTEMAX 0.5 % Gel  Generic drug:  Loteprednol Etabonate  Reported on  07/16/2015     PREMARIN 1.25 MG tablet  Generic drug:  estrogens (conjugated)  Take 2 tablets by mouth daily. Reported on 07/16/2015     VAYACOG 100-19.5-6.5 MG Caps  Generic drug:  Phosphatidylserine-DHA-EPA  Take 1 capsule by mouth daily.     ZOLOFT 100 MG tablet  Generic drug:  sertraline  Take 1 tablet (100 mg total) by mouth daily.        Allergies: No Known Allergies  Family History: Family History  Problem Relation Age of Onset  . Dementia Mother   . Osteoarthritis Mother   . Hypertension Mother   . Hypertension Father   . Osteoarthritis Father   . Healthy Sister   . Healthy Sister   . Kidney cancer Neg Hx     Social  History:  reports that she has never smoked. She has never used smokeless tobacco. She reports that she drinks about 1.2 oz of alcohol per week. She reports that she does not use illicit drugs.  ROS:      Physical Exam: BP 97/61 mmHg  Pulse 62  Ht 5\' 5"  (1.651 m)  Wt 125 lb 3.2 oz (56.79 kg)  BMI 20.83 kg/m2  Constitutional:  Alert and oriented, No acute distress. HEENT: [place] AT, moist mucus membranes.  Trachea midline, no masses. Cardiovascular: No clubbing, cyanosis, or edema. Respiratory: Normal respiratory effort, no increased work of breathing. GI: Abdomen is soft, nontender, nondistended, no abdominal masses GU: On pelvic examination Ms. Trochez had grade 2 hyper mobility of the bladder neck and no stress incontinence. She had a 1.5 by approxi-1 cm suburethral swelling in the distal third of her urethra and also to the right of the midline. It was reproducibly mildly tender but was not acutely inflamed. She does not have dyspareunia Skin: No rashes, bruises or suspicious lesions. Lymph: No cervical or inguinal adenopathy. Neurologic: Grossly intact, no focal deficits, moving all 4 extremities. Psychiatric: Normal mood and affect.  Laboratory Data:  Urinalysis    Component Value Date/Time   GLUCOSEU Negative 05/26/2015 0840   BILIRUBINUR Negative 05/26/2015 0840   BILIRUBINUR neg 04/23/2015 1433   PROTEINUR neg 04/23/2015 1433   UROBILINOGEN 0.2 04/23/2015 1433   NITRITE Negative 05/26/2015 0840   NITRITE neg 04/23/2015 1433   LEUKOCYTESUR Negative 05/26/2015 0840   LEUKOCYTESUR Negative 04/23/2015 1433    Pertinent Imaging: None  Assessment & Plan:  The patient primarily has stress incontinence but has mild urge incontinence if she holds it too long. She has minimal frequency. There is no question she has an overactive bladder on urodynamics. She may need surgery to reach her treatment goal.  My index of suspicion is low that she has a urethral diverticulum but she  should have an MRI to rule one out if she was having surgery. Potential sequelae discussed. Role of overactive bladder medications prior to proceeding with surgery discussed. There was no evidence of a diverticulum during fluoroscopy. Picture was drawn.  The patient and I came up with the following plan. She will take the beta 3 agonist for 5 weeks. She did not want an antimuscarinic due to a family history of dementia issues. She knows she is running out of nonsurgical options. If she thinks she will proceed with a sling she will call this office and we'll order a pelvic MRI to rule out a urethral diverticulum. I believe it  is with contrast but we'll need to make certain it is not with and without. She would then follow  up to discuss a sling   Reece Packer, MD  [facility] [address] [place], [place] [address] [phone]

## 2015-08-24 DIAGNOSIS — H5213 Myopia, bilateral: Secondary | ICD-10-CM | POA: Diagnosis not present

## 2015-10-25 ENCOUNTER — Telehealth: Payer: Self-pay

## 2015-10-25 DIAGNOSIS — Z0000 Encounter for general adult medical examination without abnormal findings: Secondary | ICD-10-CM | POA: Diagnosis not present

## 2015-10-25 DIAGNOSIS — K219 Gastro-esophageal reflux disease without esophagitis: Secondary | ICD-10-CM | POA: Diagnosis not present

## 2015-10-25 DIAGNOSIS — F4329 Adjustment disorder with other symptoms: Secondary | ICD-10-CM

## 2015-10-25 NOTE — Telephone Encounter (Signed)
Printed lab sheet.  Thanks,   -Mickel Baas

## 2015-10-26 LAB — COMPREHENSIVE METABOLIC PANEL
A/G RATIO: 1.7 (ref 1.2–2.2)
ALK PHOS: 60 IU/L (ref 39–117)
ALT: 11 IU/L (ref 0–32)
AST: 14 IU/L (ref 0–40)
Albumin: 4.2 g/dL (ref 3.5–5.5)
BUN / CREAT RATIO: 18 (ref 9–23)
BUN: 14 mg/dL (ref 6–24)
CHLORIDE: 100 mmol/L (ref 96–106)
CO2: 25 mmol/L (ref 18–29)
Calcium: 9.4 mg/dL (ref 8.7–10.2)
Creatinine, Ser: 0.79 mg/dL (ref 0.57–1.00)
GFR calc Af Amer: 100 mL/min/{1.73_m2} (ref >59)
GFR calc non Af Amer: 87 mL/min/{1.73_m2} (ref >59)
GLUCOSE: 84 mg/dL (ref 65–99)
Globulin, Total: 2.5 g/dL (ref 1.5–4.5)
POTASSIUM: 4.1 mmol/L (ref 3.5–5.2)
Sodium: 140 mmol/L (ref 134–144)
Total Protein: 6.7 g/dL (ref 6.0–8.5)

## 2015-10-26 LAB — CBC WITH DIFFERENTIAL/PLATELET
BASOS ABS: 0.1 10*3/uL (ref 0.0–0.2)
Basos: 1 %
EOS (ABSOLUTE): 0.1 10*3/uL (ref 0.0–0.4)
Eos: 2 %
Hematocrit: 36.1 % (ref 34.0–46.6)
Hemoglobin: 11.7 g/dL (ref 11.1–15.9)
IMMATURE GRANS (ABS): 0 10*3/uL (ref 0.0–0.1)
Immature Granulocytes: 0 %
LYMPHS: 38 %
Lymphocytes Absolute: 2.3 10*3/uL (ref 0.7–3.1)
MCH: 28.5 pg (ref 26.6–33.0)
MCHC: 32.4 g/dL (ref 31.5–35.7)
MCV: 88 fL (ref 79–97)
MONOS ABS: 0.5 10*3/uL (ref 0.1–0.9)
Monocytes: 8 %
NEUTROS ABS: 3 10*3/uL (ref 1.4–7.0)
NEUTROS PCT: 51 %
PLATELETS: 233 10*3/uL (ref 150–379)
RBC: 4.11 x10E6/uL (ref 3.77–5.28)
RDW: 13.9 % (ref 12.3–15.4)
WBC: 6 10*3/uL (ref 3.4–10.8)

## 2015-10-26 LAB — LIPID PANEL
CHOL/HDL RATIO: 2.7 ratio (ref 0.0–4.4)
Cholesterol, Total: 202 mg/dL — ABNORMAL HIGH (ref 100–199)
HDL: 74 mg/dL (ref >39)
LDL CALC: 95 mg/dL (ref 0–99)
TRIGLYCERIDES: 166 mg/dL — AB (ref 0–149)
VLDL Cholesterol Cal: 33 mg/dL (ref 5–40)

## 2015-10-26 LAB — TSH: TSH: 3.83 u[IU]/mL (ref 0.450–4.500)

## 2016-04-19 ENCOUNTER — Other Ambulatory Visit: Payer: Self-pay | Admitting: *Deleted

## 2016-04-19 DIAGNOSIS — F4321 Adjustment disorder with depressed mood: Secondary | ICD-10-CM

## 2016-04-19 MED ORDER — ESTROGENS CONJUGATED 1.25 MG PO TABS: 2.5000 mg | ORAL_TABLET | Freq: Every day | ORAL | 3 refills | Status: DC

## 2016-04-19 MED ORDER — ZOLOFT 100 MG PO TABS: 100.0000 mg | ORAL_TABLET | Freq: Every day | ORAL | 3 refills | Status: DC

## 2016-05-27 ENCOUNTER — Ambulatory Visit (INDEPENDENT_AMBULATORY_CARE_PROVIDER_SITE_OTHER): Payer: 59 | Admitting: Family Medicine

## 2016-05-27 DIAGNOSIS — Z23 Encounter for immunization: Secondary | ICD-10-CM

## 2016-05-28 NOTE — Progress Notes (Signed)
For flu shot only.

## 2016-06-20 ENCOUNTER — Other Ambulatory Visit: Payer: Self-pay | Admitting: Family Medicine

## 2016-06-20 MED ORDER — OSELTAMIVIR PHOSPHATE 75 MG PO CAPS: 75.0000 mg | ORAL_CAPSULE | Freq: Two times a day (BID) | ORAL | 0 refills | Status: AC

## 2016-07-13 ENCOUNTER — Other Ambulatory Visit: Payer: Self-pay | Admitting: Emergency Medicine

## 2016-07-13 DIAGNOSIS — M79673 Pain in unspecified foot: Secondary | ICD-10-CM

## 2016-07-13 NOTE — Addendum Note (Signed)
Addended by: Ermalinda Barrios on: 07/13/2016 02:41 PM   Modules accepted: Orders

## 2016-07-19 DIAGNOSIS — M7752 Other enthesopathy of left foot: Secondary | ICD-10-CM | POA: Diagnosis not present

## 2016-07-24 ENCOUNTER — Other Ambulatory Visit: Payer: Self-pay | Admitting: Family Medicine

## 2016-07-24 MED ORDER — CIPROFLOXACIN HCL 0.3 % OP SOLN: OPHTHALMIC | 1 refills | Status: DC

## 2016-07-24 NOTE — Progress Notes (Signed)
c 

## 2016-11-13 ENCOUNTER — Other Ambulatory Visit: Payer: Self-pay | Admitting: Family Medicine

## 2016-11-13 DIAGNOSIS — Z411 Encounter for cosmetic surgery: Secondary | ICD-10-CM | POA: Diagnosis not present

## 2016-11-13 DIAGNOSIS — H5340 Unspecified visual field defects: Secondary | ICD-10-CM | POA: Diagnosis not present

## 2016-11-13 DIAGNOSIS — L908 Other atrophic disorders of skin: Secondary | ICD-10-CM | POA: Diagnosis not present

## 2016-11-13 DIAGNOSIS — F4321 Adjustment disorder with depressed mood: Secondary | ICD-10-CM

## 2016-11-13 DIAGNOSIS — H02839 Dermatochalasis of unspecified eye, unspecified eyelid: Secondary | ICD-10-CM | POA: Diagnosis not present

## 2016-11-13 MED ORDER — SERTRALINE HCL 100 MG PO TABS: 100.0000 mg | ORAL_TABLET | Freq: Every day | ORAL | 1 refills | Status: DC

## 2016-11-28 DIAGNOSIS — H5213 Myopia, bilateral: Secondary | ICD-10-CM | POA: Diagnosis not present

## 2016-12-04 DIAGNOSIS — H02836 Dermatochalasis of left eye, unspecified eyelid: Secondary | ICD-10-CM | POA: Diagnosis not present

## 2016-12-04 DIAGNOSIS — H02833 Dermatochalasis of right eye, unspecified eyelid: Secondary | ICD-10-CM | POA: Diagnosis not present

## 2016-12-04 DIAGNOSIS — H5340 Unspecified visual field defects: Secondary | ICD-10-CM | POA: Diagnosis not present

## 2017-04-10 ENCOUNTER — Other Ambulatory Visit: Payer: Self-pay

## 2017-04-10 MED ORDER — ZOLOFT 100 MG PO TABS: 100.0000 mg | ORAL_TABLET | Freq: Every day | ORAL | 6 refills | Status: DC

## 2017-04-10 NOTE — Telephone Encounter (Signed)
Requesting the Brand name Zoloft to be sent into the pharmacy instead of generic.

## 2017-04-11 ENCOUNTER — Ambulatory Visit: Payer: 59 | Admitting: Obstetrics and Gynecology

## 2017-04-11 ENCOUNTER — Encounter: Payer: Self-pay | Admitting: Obstetrics and Gynecology

## 2017-04-11 VITALS — BP 100/66 | HR 80 | Ht 65.0 in | Wt 122.3 lb

## 2017-04-11 DIAGNOSIS — Z1231 Encounter for screening mammogram for malignant neoplasm of breast: Secondary | ICD-10-CM | POA: Diagnosis not present

## 2017-04-11 DIAGNOSIS — Z90710 Acquired absence of both cervix and uterus: Secondary | ICD-10-CM

## 2017-04-11 DIAGNOSIS — Z1239 Encounter for other screening for malignant neoplasm of breast: Secondary | ICD-10-CM

## 2017-04-11 DIAGNOSIS — N393 Stress incontinence (female) (male): Secondary | ICD-10-CM | POA: Diagnosis not present

## 2017-04-11 DIAGNOSIS — Z1211 Encounter for screening for malignant neoplasm of colon: Secondary | ICD-10-CM | POA: Diagnosis not present

## 2017-04-11 DIAGNOSIS — Z780 Asymptomatic menopausal state: Secondary | ICD-10-CM

## 2017-04-11 DIAGNOSIS — Z0000 Encounter for general adult medical examination without abnormal findings: Secondary | ICD-10-CM

## 2017-04-11 NOTE — Progress Notes (Signed)
ANNUAL PREVENTATIVE CARE GYN  ENCOUNTER NOTE  Subjective:       Alyssa Humphrey is a 51 y.o. [id number] female here for a routine annual gynecologic exam.  Current complaints: 1.  Stress urinary incontinence; continuous leaking as she exercises, especially with running, having to wear pads during activity.  Has done physical therapy in the past with Kegel exercises without significant improvement; has seen urology and was considering pubovaginal sling but she is shying away from that at this time.  She  is interested in nonsurgical approach to management of stress urinary incontinence.  2.  Menopause; on Premarin 1.25 mg daily with good control of vasomotor symptoms when she remembers to take the medication.  She is not taking calcium and vitamin D.  She does exercise routinely including walking/running/lifting weights.  3.  Past surgical procedures include: (Records unavailable for review)  Indicated low forceps delivery-MAD  Cesarean section-MAD  Repeat cesarean section-MAD  LSH ?BSO-Rick Evans   Gynecologic History No LMP recorded. Patient has had a hysterectomy. Contraception: status post hysterectomy Last Pap: unknown. Results were: normal Last mammogram: 2014 birad 2. Results were: normal Medical record review (Dr. Venia Minks 2016) reveals the following: 10/04/07 Pap-neg 11/28/12 Mammo-BI-RADS 2 01/06/11 Colon-WNL  Obstetric History OB History  Gravida Para Term Preterm AB Living  4 3     1     SAB TAB Ectopic Multiple Live Births  1            # Outcome Date GA Lbr Len/2nd Weight Sex Delivery Anes PTL Lv  4 SAB           3 Para           2 Para           1 Para               Past Medical History:  Diagnosis Date  . Acid reflux 03/15/2015  . Adaptation reaction 03/15/2015  . Dermatitis, eczematoid 03/15/2015   hand   . Family history of Alzheimer's disease 03/15/2015  . Impingement syndrome of shoulder 08/11/2002   Injected per Ortho   . Patella-femoral syndrome 04/25/2010    treated by Dr Cipriano Mile   . Post menopausal syndrome 03/15/2015  . Skin lesion 09/28/2003   Lesion on right chin removed by Dr Debe Coder Clarcke-Compound nevus   . Stress incontinence 01/05/2009   Treated by Dr. Arlyn Leak     Past Surgical History:  Procedure Laterality Date  . ABDOMINAL HYSTERECTOMY  2007  . CESAREAN SECTION  04/26/92; 01/15/96; 12/18/99  . DILATION AND CURETTAGE OF UTERUS     x's 2  . KNEE SURGERY Left   . NASAL SINUS SURGERY      Current Outpatient Medications on File Prior to Visit  Medication Sig Dispense Refill  . Biotin 10 MG CAPS Take by mouth.    . estrogens, conjugated, (PREMARIN) 1.25 MG tablet Take 2 tablets (2.5 mg total) by mouth daily. Reported on 07/16/2015 (Patient taking differently: Take 1.25 mg by mouth daily. Reported on 07/16/2015) 180 tablet 3  . Phosphatidylserine-DHA-EPA (VAYACOG) 100-19.5-6.5 MG CAPS Take 1 capsule by mouth daily.    Marland Kitchen ZOLOFT 100 MG tablet Take 1 tablet (100 mg total) by mouth daily. BRAND NAME MEDICALLY NECESSARY 30 tablet 6   No current facility-administered medications on file prior to visit.     No Known Allergies  Social History   Socioeconomic History  . Marital status: Married    Spouse name: Not  on file  . Number of children: Not on file  . Years of education: Not on file  . Highest education level: Not on file  Social Needs  . Financial resource strain: Not on file  . Food insecurity - worry: Not on file  . Food insecurity - inability: Not on file  . Transportation needs - medical: Not on file  . Transportation needs - non-medical: Not on file  Occupational History  . Not on file  Tobacco Use  . Smoking status: Never Smoker  . Smokeless tobacco: Never Used  Substance and Sexual Activity  . Alcohol use: Yes    Alcohol/week: 1.2 oz    Types: 2 Glasses of wine per week  . Drug use: No  . Sexual activity: Not on file  Other Topics Concern  . Not on file  Social History Narrative  . Not on file     Family History  Problem Relation Age of Onset  . Dementia Mother   . Osteoarthritis Mother   . Hypertension Mother   . Hypertension Father   . Osteoarthritis Father   . Healthy Sister   . Healthy Sister   . Kidney cancer Neg Hx     The following portions of the patient's history were reviewed and updated as appropriate: allergies, current medications, past family history, past medical history, past social history, past surgical history and problem list.  Review of Systems ROS Review of Systems - General ROS: negative for - chills, fatigue, fever, hot flashes, night sweats, weight gain or weight loss Psychological ROS: negative for - anxiety, decreased libido, depression, mood swings, physical abuse or sexual abuse Ophthalmic ROS: negative for - blurry vision, eye pain or loss of vision ENT ROS: negative for - headaches, hearing change, visual changes or vocal changes Allergy and Immunology ROS: negative for - hives, itchy/watery eyes or seasonal allergies Hematological and Lymphatic ROS: negative for - bleeding problems, bruising, swollen lymph nodes or weight loss Endocrine ROS: negative for - galactorrhea, hair pattern changes, hot flashes, malaise/lethargy, mood swings, palpitations, polydipsia/polyuria, skin changes, temperature intolerance or unexpected weight changes Breast ROS: negative for - new or changing breast lumps or nipple discharge Respiratory ROS: negative for - cough or shortness of breath Cardiovascular ROS: negative for - chest pain, irregular heartbeat, palpitations or shortness of breath Gastrointestinal ROS: no abdominal pain, change in bowel habits, or black or bloody stools Genito-Urinary ROS: no dysuria, trouble voiding, or hematuria Musculoskeletal ROS: negative for - joint pain or joint stiffness Neurological ROS: negative for - bowel and bladder control changes Dermatological ROS: negative for rash and skin lesion changes   Objective:   BP 100/66    Pulse 80   Ht 5\' 5"  (1.651 m)   Wt 122 lb 4.8 oz (55.5 kg)   BMI 20.35 kg/m  CONSTITUTIONAL: Well-developed, well-nourished female in no acute distress.  PSYCHIATRIC: Normal mood and affect. Normal behavior. Normal judgment and thought content. [place]: Alert and oriented to person, place, and time. Normal muscle tone coordination. No cranial nerve deficit noted. HENT:  Normocephalic, atraumatic, External right and left ear normal. Oropharynx is clear and moist EYES: Conjunctivae and EOM are normal. No scleral icterus.  NECK: Normal range of motion, supple, no masses.  Normal thyroid.  SKIN: Skin is warm and dry. No rash noted. Not diaphoretic. No erythema. No pallor. CARDIOVASCULAR: Normal heart rate noted, regular rhythm, no murmur. RESPIRATORY: Clear to auscultation bilaterally. Effort and breath sounds normal, no problems with respiration noted. BREASTS: Symmetric  in size. No masses, skin changes, nipple drainage, or lymphadenopathy. ABDOMEN: Soft, normal bowel sounds, no distention noted.  No tenderness, rebound or guarding.  BLADDER: Normal PELVIC:  External Genitalia: Normal  BUS: Normal  Vagina: Normal estrogen effect; first-degree cystocele; no rectocele  Cervix: Normal; no cervical motion tenderness; well supported  Uterus: Surgically absent  Adnexa: Nonpalpable and nontender  RV: External Exam NormaI, No Rectal Masses and Normal Sphincter tone  MUSCULOSKELETAL: Normal range of motion. No tenderness.  No cyanosis, clubbing, or edema.  2+ distal pulses. LYMPHATIC: No Axillary, Supraclavicular, or Inguinal Adenopathy.    Assessment:   Annual gynecologic examination 51 y.o. Contraception: status post hysterectomy status post [place] Normal BMI Problem List Items Addressed This Visit    Annual physical exam - Primary    Stress urinary incontinence  Plan:  Pap: PAP W/HPV Mammogram: Ordered Stool Guaiac Testing:  order colonoscopy- Dr Allen Norris Labs: lipid a1c fbs  tsh Routine preventative health maintenance measures emphasized: Exercise/Diet/Weight control, Tobacco Warnings, Alcohol/Substance use risks and Stress Management Return in 1 week for incontinence pessary fitting Refill Premarin 1.25 mg daily Return to [place], CMA  Brayton Mars, MD   Note: This dictation was prepared with Dragon dictation along with smaller phrase technology. Any transcriptional errors that result from this process are unintentional.

## 2017-04-11 NOTE — Patient Instructions (Addendum)
1.  No Pap smear needed 2.  Mammogram ordered 3.  Screening colonoscopy is to be scheduled 4.  Screening labs are ordered 5.  Continue with healthy eating and exercise 6.  Premarin 1.25 mg daily is written 7.  Continue with calcium and vitamin D supplementation 1200 mg / 800 international units daily 8.  Return in 1 week for pessary fitting 9.  Return in 1 year for annual exam   Health Maintenance for Postmenopausal Women Menopause is a normal process in which your reproductive ability comes to an end. This process happens gradually over a span of months to years, usually between the ages of 71 and 15. Menopause is complete when you have missed 12 consecutive menstrual periods. It is important to talk with your health care provider about some of the most common conditions that affect postmenopausal women, such as heart disease, cancer, and bone loss (osteoporosis). Adopting a healthy lifestyle and getting preventive care can help to promote your health and wellness. Those actions can also lower your chances of developing some of these common conditions. What should I know about menopause? During menopause, you may experience a number of symptoms, such as:  Moderate-to-severe hot flashes.  Night sweats.  Decrease in sex drive.  Mood swings.  Headaches.  Tiredness.  Irritability.  Memory problems.  Insomnia.  Choosing to treat or not to treat menopausal changes is an individual decision that you make with your health care provider. What should I know about hormone replacement therapy and supplements? Hormone therapy products are effective for treating symptoms that are associated with menopause, such as hot flashes and night sweats. Hormone replacement carries certain risks, especially as you become older. If you are thinking about using estrogen or estrogen with progestin treatments, discuss the benefits and risks with your health care provider. What should I know about heart  disease and stroke? Heart disease, heart attack, and stroke become more likely as you age. This may be due, in part, to the hormonal changes that your body experiences during menopause. These can affect how your body processes dietary fats, triglycerides, and cholesterol. Heart attack and stroke are both medical emergencies. There are many things that you can do to help prevent heart disease and stroke:  Have your blood pressure checked at least every 1-2 years. High blood pressure causes heart disease and increases the risk of stroke.  If you are 58-69 years old, ask your health care provider if you should take aspirin to prevent a heart attack or a stroke.  Do not use any tobacco products, including cigarettes, chewing tobacco, or electronic cigarettes. If you need help quitting, ask your health care provider.  It is important to eat a healthy diet and maintain a healthy weight. ? Be sure to include plenty of vegetables, fruits, low-fat dairy products, and lean protein. ? Avoid eating foods that are high in solid fats, added sugars, or salt (sodium).  Get regular exercise. This is one of the most important things that you can do for your health. ? Try to exercise for at least 150 minutes each week. The type of exercise that you do should increase your heart rate and make you sweat. This is known as moderate-intensity exercise. ? Try to do strengthening exercises at least twice each week. Do these in addition to the moderate-intensity exercise.  Know your numbers.Ask your health care provider to check your cholesterol and your blood glucose. Continue to have your blood tested as directed by your health care  provider.  What should I know about cancer screening? There are several types of cancer. Take the following steps to reduce your risk and to catch any cancer development as early as possible. Breast Cancer  Practice breast self-awareness. ? This means understanding how your breasts  normally appear and feel. ? It also means doing regular breast self-exams. Let your health care provider know about any changes, no matter how small.  If you are 29 or older, have a clinician do a breast exam (clinical breast exam or CBE) every year. Depending on your age, family history, and medical history, it may be recommended that you also have a yearly breast X-ray (mammogram).  If you have a family history of breast cancer, talk with your health care provider about genetic screening.  If you are at high risk for breast cancer, talk with your health care provider about having an MRI and a mammogram every year.  Breast cancer (BRCA) gene test is recommended for women who have family members with BRCA-related cancers. Results of the assessment will determine the need for genetic counseling and BRCA1 and for BRCA2 testing. BRCA-related cancers include these types: ? Breast. This occurs in males or females. ? Ovarian. ? Tubal. This may also be called fallopian tube cancer. ? Cancer of the abdominal or pelvic lining (peritoneal cancer). ? Prostate. ? Pancreatic.  Cervical, Uterine, and Ovarian Cancer Your health care provider may recommend that you be screened regularly for cancer of the pelvic organs. These include your ovaries, uterus, and vagina. This screening involves a pelvic exam, which includes checking for microscopic changes to the surface of your cervix (Pap test).  For women ages 21-65, health care providers may recommend a pelvic exam and a Pap test every three years. For women ages 7-65, they may recommend the Pap test and pelvic exam, combined with testing for human papilloma virus (HPV), every five years. Some types of HPV increase your risk of cervical cancer. Testing for HPV may also be done on women of any age who have unclear Pap test results.  Other health care providers may not recommend any screening for nonpregnant women who are considered low risk for pelvic cancer  and have no symptoms. Ask your health care provider if a screening pelvic exam is right for you.  If you have had past treatment for cervical cancer or a condition that could lead to cancer, you need Pap tests and screening for cancer for at least 20 years after your treatment. If Pap tests have been discontinued for you, your risk factors (such as having a new sexual partner) need to be reassessed to determine if you should start having screenings again. Some women have medical problems that increase the chance of getting cervical cancer. In these cases, your health care provider may recommend that you have screening and Pap tests more often.  If you have a family history of uterine cancer or ovarian cancer, talk with your health care provider about genetic screening.  If you have vaginal bleeding after reaching menopause, tell your health care provider.  There are currently no reliable tests available to screen for ovarian cancer.  Lung Cancer Lung cancer screening is recommended for adults 55-32 years old who are at high risk for lung cancer because of a history of smoking. A yearly low-dose CT scan of the lungs is recommended if you:  Currently smoke.  Have a history of at least 30 pack-years of smoking and you currently smoke or have quit within  the past 15 years. A pack-year is smoking an average of one pack of cigarettes per day for one year.  Yearly screening should:  Continue until it has been 15 years since you quit.  Stop if you develop a health problem that would prevent you from having lung cancer treatment.  Colorectal Cancer  This type of cancer can be detected and can often be prevented.  Routine colorectal cancer screening usually begins at age 71 and continues through age 76.  If you have risk factors for colon cancer, your health care provider may recommend that you be screened at an earlier age.  If you have a family history of colorectal cancer, talk with your  health care provider about genetic screening.  Your health care provider may also recommend using home test kits to check for hidden blood in your stool.  A small camera at the end of a tube can be used to examine your colon directly (sigmoidoscopy or colonoscopy). This is done to check for the earliest forms of colorectal cancer.  Direct examination of the colon should be repeated every 5-10 years until age 36. However, if early forms of precancerous polyps or small growths are found or if you have a family history or genetic risk for colorectal cancer, you may need to be screened more often.  Skin Cancer  Check your skin from head to toe regularly.  Monitor any moles. Be sure to tell your health care provider: ? About any new moles or changes in moles, especially if there is a change in a mole's shape or color. ? If you have a mole that is larger than the size of a pencil eraser.  If any of your family members has a history of skin cancer, especially at a young age, talk with your health care provider about genetic screening.  Always use sunscreen. Apply sunscreen liberally and repeatedly throughout the day.  Whenever you are outside, protect yourself by wearing long sleeves, pants, a wide-brimmed hat, and sunglasses.  What should I know about osteoporosis? Osteoporosis is a condition in which bone destruction happens more quickly than new bone creation. After menopause, you may be at an increased risk for osteoporosis. To help prevent osteoporosis or the bone fractures that can happen because of osteoporosis, the following is recommended:  If you are 36-23 years old, get at least 1,000 mg of calcium and at least 600 mg of vitamin D per day.  If you are older than age 23 but younger than age 61, get at least 1,200 mg of calcium and at least 600 mg of vitamin D per day.  If you are older than age 3, get at least 1,200 mg of calcium and at least 800 mg of vitamin D per day.  Smoking  and excessive alcohol intake increase the risk of osteoporosis. Eat foods that are rich in calcium and vitamin D, and do weight-bearing exercises several times each week as directed by your health care provider. What should I know about how menopause affects my mental health? Depression may occur at any age, but it is more common as you become older. Common symptoms of depression include:  Low or sad mood.  Changes in sleep patterns.  Changes in appetite or eating patterns.  Feeling an overall lack of motivation or enjoyment of activities that you previously enjoyed.  Frequent crying spells.  Talk with your health care provider if you think that you are experiencing depression. What should I know about immunizations? It  is important that you get and maintain your immunizations. These include:  Tetanus, diphtheria, and pertussis (Tdap) booster vaccine.  Influenza every year before the flu season begins.  Pneumonia vaccine.  Shingles vaccine.  Your health care provider may also recommend other immunizations. This information is not intended to replace advice given to you by your health care provider. Make sure you discuss any questions you have with your health care provider. Document Released: 06/16/2005 Document Revised: 11/12/2015 Document Reviewed: 01/26/2015 Elsevier Interactive Patient Education  2018 [employer].

## 2017-04-12 LAB — GLUCOSE, RANDOM: Glucose: 89 mg/dL (ref 65–99)

## 2017-04-12 LAB — LIPID PANEL
CHOLESTEROL TOTAL: 207 mg/dL — AB (ref 100–199)
Chol/HDL Ratio: 2.7 ratio (ref 0.0–4.4)
HDL: 76 mg/dL (ref >39)
LDL CALC: 103 mg/dL — AB (ref 0–99)
Triglycerides: 141 mg/dL (ref 0–149)
VLDL CHOLESTEROL CAL: 28 mg/dL (ref 5–40)

## 2017-04-12 LAB — HEMOGLOBIN A1C
ESTIMATED AVERAGE GLUCOSE: 108 mg/dL
Hgb A1c MFr Bld: 5.4 % (ref 4.8–5.6)

## 2017-04-12 LAB — TSH: TSH: 2.79 u[IU]/mL (ref 0.450–4.500)

## 2017-04-17 LAB — IGP, COBASHPV16/18
HPV 16: NEGATIVE
HPV 18: NEGATIVE
HPV OTHER HR TYPES: NEGATIVE
PAP SMEAR COMMENT: 0

## 2017-04-18 ENCOUNTER — Encounter: Payer: 59 | Admitting: Obstetrics and Gynecology

## 2017-04-25 ENCOUNTER — Telehealth: Payer: Self-pay | Admitting: Family Medicine

## 2017-04-25 NOTE — Telephone Encounter (Signed)
Open in ERROR

## 2017-05-23 ENCOUNTER — Ambulatory Visit
Admission: RE | Admit: 2017-05-23 | Discharge: 2017-05-23 | Disposition: A | Discharge status: Home / Self Care | Payer: 59 | Source: Ambulatory Visit | Attending: Obstetrics and Gynecology | Admitting: Obstetrics and Gynecology

## 2017-05-23 DIAGNOSIS — Z1231 Encounter for screening mammogram for malignant neoplasm of breast: Secondary | ICD-10-CM | POA: Diagnosis not present

## 2017-05-23 DIAGNOSIS — Z1239 Encounter for other screening for malignant neoplasm of breast: Secondary | ICD-10-CM

## 2017-05-28 ENCOUNTER — Telehealth: Payer: Self-pay | Admitting: Family Medicine

## 2017-05-28 MED ORDER — OSELTAMIVIR PHOSPHATE 75 MG PO CAPS: 75.0000 mg | ORAL_CAPSULE | Freq: Two times a day (BID) | ORAL | 0 refills | Status: AC

## 2017-05-28 NOTE — Telephone Encounter (Signed)
Dr. Rosanna Randy reports she developed high fever 103, dry cough and myalgias over the weekend. Will start tamiflu today and call if any new sx or if not improving on Tamiflu

## 2017-06-15 ENCOUNTER — Ambulatory Visit
Admission: RE | Admit: 2017-06-15 | Discharge: 2017-06-15 | Disposition: A | Discharge status: Home / Self Care | Payer: 59 | Source: Ambulatory Visit | Attending: Family Medicine | Admitting: Family Medicine

## 2017-06-15 ENCOUNTER — Ambulatory Visit (INDEPENDENT_AMBULATORY_CARE_PROVIDER_SITE_OTHER): Payer: 59 | Admitting: Family Medicine

## 2017-06-15 ENCOUNTER — Encounter: Payer: Self-pay | Admitting: Family Medicine

## 2017-06-15 VITALS — BP 100/62 | HR 59 | Temp 97.4°F | Resp 18

## 2017-06-15 DIAGNOSIS — R05 Cough: Secondary | ICD-10-CM

## 2017-06-15 DIAGNOSIS — J019 Acute sinusitis, unspecified: Secondary | ICD-10-CM | POA: Diagnosis not present

## 2017-06-15 DIAGNOSIS — R059 Cough, unspecified: Secondary | ICD-10-CM

## 2017-06-15 MED ORDER — PREDNISONE 20 MG PO TABS: 20.0000 mg | ORAL_TABLET | Freq: Two times a day (BID) | ORAL | 0 refills | Status: AC

## 2017-06-15 MED ORDER — AMOXICILLIN 500 MG PO CAPS: 1000.0000 mg | ORAL_CAPSULE | Freq: Two times a day (BID) | ORAL | 0 refills | Status: AC

## 2017-06-15 NOTE — Progress Notes (Signed)
Patient: Alyssa Humphrey Female    DOB: [birth year]   53 y.o.   MRN: [id number] Visit Date: 06/15/2017  Today's Provider: Lelon Huh, MD   Chief Complaint  Patient presents with  . Cough    x 3 weeks   Subjective:    HPI  She initially developed flu symptoms including cough, myalgias, headache and fevers to 103 and was prescribed 5 days Tamiflu on 1/21.  Fevers and myalgias resolved but has had persistent cough since that time. Is non-productive. Having some pressure around frontal and maxillary sinuses. No dyspnea. Has been extremely fatigued and starting losing voice over the last few days.      No Known Allergies   Current Outpatient Medications:  .  Biotin 10 MG CAPS, Take by mouth., Disp: , Rfl:  .  estrogens, conjugated, (PREMARIN) 1.25 MG tablet, Take 2 tablets (2.5 mg total) by mouth daily. Reported on 07/16/2015 (Patient taking differently: Take 1.25 mg by mouth daily. Reported on 07/16/2015), Disp: 180 tablet, Rfl: 3 .  Phosphatidylserine-DHA-EPA (VAYACOG) 100-19.5-6.5 MG CAPS, Take 1 capsule by mouth daily., Disp: , Rfl:  .  ZOLOFT 100 MG tablet, Take 1 tablet (100 mg total) by mouth daily. BRAND NAME MEDICALLY NECESSARY, Disp: 30 tablet, Rfl: 6  Review of Systems  Constitutional: Positive for appetite change and fatigue.  HENT: Positive for voice change.   Respiratory: Negative for chest tightness.   Cardiovascular: Negative for palpitations.  Gastrointestinal: Negative for abdominal pain, nausea and vomiting.  Neurological: Negative for dizziness and weakness.    Social History   Tobacco Use  . Smoking status: Never Smoker  . Smokeless tobacco: Never Used  Substance Use Topics  . Alcohol use: Yes    Alcohol/week: 1.2 oz    Types: 2 Glasses of wine per week   Objective:   BP 100/62 (BP Location: Left Arm, Patient Position: Sitting, Cuff Size: Normal)   Pulse (!) 59   Temp (!) 97.4 F (36.3 C)   Resp 18   SpO2 97% Comment: room air There were no  vitals filed for this visit.   Physical Exam  General Appearance:    Alert, cooperative, no distress  HENT:   bilateral TM normal without fluid or infection, neck without nodes, throat normal without erythema or exudate, frontal and maxillary sinus tender and nasal mucosa congested  Eyes:    PERRL, conjunctiva/corneas clear, EOM's intact       Lungs:     Clear to auscultation bilaterally, respirations unlabored  Heart:    Regular rate and rhythm  Neurologic:   Awake, alert, oriented x 3. No apparent focal neurological           defect.      Chest Xr- No acute disease process.      Assessment & Plan:     1. Cough Persistent 2 weeks following treatment for flu symptoms with tamiflu. May be post viral bronchitis versus sinusitis.  - DG Chest 2 View; Future - predniSONE (DELTASONE) 20 MG tablet; Take 1 tablet (20 mg total) by mouth 2 (two) times daily with a meal for 5 days.  Dispense: 10 tablet; Refill: 0  2. Acute sinusitis, recurrence not specified, unspecified location  - amoxicillin (AMOXIL) 500 MG capsule; Take 2 capsules (1,000 mg total) by mouth 2 (two) times daily for 7 days.  Dispense: 28 capsule; Refill: 0 - predniSONE (DELTASONE) 20 MG tablet; Take 1 tablet (20 mg total) by mouth 2 (two) times  daily with a meal for 5 days.  Dispense: 10 tablet; Refill: 0       Lelon Huh, MD  [facility]

## 2017-07-12 ENCOUNTER — Telehealth: Payer: Self-pay | Admitting: *Deleted

## 2017-07-12 MED ORDER — SERTRALINE HCL 100 MG PO TABS: 100.0000 mg | ORAL_TABLET | Freq: Every day | ORAL | 3 refills | Status: DC

## 2017-07-12 NOTE — Telephone Encounter (Signed)
Requesting refill for sertraline 100 mg qd. 90 day supply. Patient tolerated well. Brand name Exelan.

## 2017-10-03 ENCOUNTER — Encounter: Payer: Self-pay | Admitting: Obstetrics and Gynecology

## 2017-10-03 ENCOUNTER — Ambulatory Visit: Payer: 59 | Admitting: Obstetrics and Gynecology

## 2017-10-03 VITALS — BP 102/69 | HR 73 | Ht 65.0 in | Wt 122.6 lb

## 2017-10-03 DIAGNOSIS — N393 Stress incontinence (female) (male): Secondary | ICD-10-CM

## 2017-10-04 NOTE — Patient Instructions (Signed)
1.  The pessary fitting was successful today-60 mm incontinence dish with notch is ordered 2.  Return in 1 to 2 weeks when pessary device arrives for insertion

## 2017-10-04 NOTE — Progress Notes (Signed)
Chief complaint: 1.  Stress urinary incontinence 2.  Pessary fitting  Simya Tercero is status post [place].  Stress urinary incontinence is an ongoing issue.  Urology evaluation suggested that she may not be a good candidate for a pubovaginal sling because of subtle anatomic changes in the anterior vaginal wall.  OBJECTIVE: BP 102/69   Pulse 73   Ht 5\' 5"  (1.651 m)   Wt 122 lb 9.6 oz (55.6 kg)   BMI 20.40 kg/m  Pleasant female no acute distress.  Alert and oriented. Abdomen: Soft, nontender PELVIC:             External Genitalia: Normal             BUS: Normal             Vagina: Normal estrogen effect; first-degree cystocele; no rectocele; vaginal introitus snug             Cervix: Normal; no cervical motion tenderness; well supported             Uterus: Surgically absent             Adnexa: Nonpalpable and nontender             RV: External Exam NormaI  PROCEDURE: Pessary fitting  65 mm incontinence dish with notch-too large with snug fit and difficulty with removal  57 mm ring with diaphragm support-fits well but does not prevent incontinence  ASSESSMENT: 1.  Stress urinary incontinence 2.  Status post pessary fitting  PLAN: 1.  24mm incontinence dish with notch is ordered.  Anticipate this to be the optimal fit with successful prevention of stress incontinence 2.  Return in 1 to 2 weeks for pessary insertion when the device arrives.  Brayton Mars, MD  Note: This dictation was prepared with Dragon dictation along with smaller phrase technology. Any transcriptional errors that result from this process are unintentional.

## 2017-10-09 ENCOUNTER — Other Ambulatory Visit: Payer: Self-pay | Admitting: Family Medicine

## 2017-10-09 ENCOUNTER — Other Ambulatory Visit (INDEPENDENT_AMBULATORY_CARE_PROVIDER_SITE_OTHER): Payer: 59

## 2017-10-09 DIAGNOSIS — R300 Dysuria: Secondary | ICD-10-CM | POA: Diagnosis not present

## 2017-10-09 LAB — POCT URINALYSIS DIPSTICK
BILIRUBIN UA: NEGATIVE
GLUCOSE UA: NEGATIVE
KETONES UA: NEGATIVE
Nitrite, UA: NEGATIVE
Protein, UA: NEGATIVE
Spec Grav, UA: 1.01 (ref 1.010–1.025)
Urobilinogen, UA: 0.2 E.U./dL
pH, UA: 6.5 (ref 5.0–8.0)

## 2017-10-09 NOTE — Progress Notes (Signed)
POCT urinalysis done

## 2017-10-12 ENCOUNTER — Telehealth: Payer: Self-pay | Admitting: *Deleted

## 2017-10-12 ENCOUNTER — Telehealth: Payer: Self-pay | Admitting: Obstetrics and Gynecology

## 2017-10-12 LAB — CULTURE, URINE COMPREHENSIVE

## 2017-10-12 NOTE — Telephone Encounter (Signed)
See result note.  

## 2017-10-12 NOTE — Telephone Encounter (Signed)
Patient had called office earlier this morning concerning urine culture. We have not received results yet. Called [facility], who stated test is still pending. Patient stated she feels pretty miserable today. She is having burning, pelvic pain and discomfort when sitting. Patients she is still taking Azo with only mild relief. Patient is requesting a medication to help with symptoms. Please advise?

## 2017-10-12 NOTE — Telephone Encounter (Signed)
Please call [facility] again and find out what the problem is. Culture was sent 3 day ago, it's not normal to take this long.

## 2017-10-12 NOTE — Telephone Encounter (Signed)
S/p pessary fitting. Pt states that since Monday she has vaginal burning, uncomfortable while sitting,dysuria, and frequency. Pt had u/a and cns done at Dr. Marlan Palau office. Both were neg.  Possible abrasion from pessary fitting. Advised pt to use premarin cream twice weekly. Sample left up front. Pt to contact office on Wed. If no better will make an appt to be seen. AC aware. She agrees with plan.

## 2017-10-12 NOTE — Telephone Encounter (Signed)
The patient called and stated that she would like to speak with her nurse Crystal in regards to her having some UTI symptoms, The patient is extremely uncomfortable and would like to find a solution as soon as possible. Please advise.

## 2017-10-18 ENCOUNTER — Encounter: Payer: 59 | Admitting: Obstetrics and Gynecology

## 2018-02-18 ENCOUNTER — Ambulatory Visit (INDEPENDENT_AMBULATORY_CARE_PROVIDER_SITE_OTHER): Payer: 59 | Admitting: Family Medicine

## 2018-02-18 DIAGNOSIS — Z23 Encounter for immunization: Secondary | ICD-10-CM

## 2018-02-20 ENCOUNTER — Ambulatory Visit: Payer: 59 | Admitting: Family Medicine

## 2018-02-25 ENCOUNTER — Other Ambulatory Visit: Payer: Self-pay | Admitting: Family Medicine

## 2018-04-15 NOTE — Progress Notes (Signed)
ANNUAL PREVENTATIVE CARE GYN  ENCOUNTER NOTE  Subjective:       Alyssa Humphrey is a 52 y.o. [phone] female here for a routine annual gynecologic exam.  Current complaints: 1.  Patient is still having stress urinary incontinence; continuous leaking as she exercises, especially with running, having to wear pads during activity. She still may consider surgery at some point.  Pessary trial was unsuccessful.  I have recommended that she consider the poise impressa bladder supports.  2.  Menopause; on Premarin 1.25 mg daily with good control of vasomotor symptoms when she remembers to take the medication.  She is not taking calcium and vitamin D.  She does exercise routinely including walking/running/lifting weights.  3.  Decreased libido: Alyssa Humphrey states that she is very accommodating with her husband but feels badly that lack of desire has negatively impacted his responsiveness.  We discussed the new medication which is out for women with low libido and she is very willing to try the injectable medication-Vyleese  3.  Past surgical procedures include: (Records unavailable for review)  Indicated low forceps delivery-MAD  Cesarean section-MAD  Repeat cesarean section-MAD  LSH ?BSO-Rick Evans   Gynecologic History No LMP recorded. Patient has had a hysterectomy. Contraception: status post hysterectomy Last Pap: unknown. Results were: normal Last mammogram: 2014 birad 2. Results were: normal Medical record review (Dr. Venia Minks 2016) reveals the following: 04/11/2017 Pap/HPV-neg/negative 05/24/2017 mammo-BI-RADS 1 01/06/11 Colon-WNL  Obstetric History OB History  Gravida Para Term Preterm AB Living  4 3     1     SAB TAB Ectopic Multiple Live Births  1            # Outcome Date GA Lbr Len/2nd Weight Sex Delivery Anes PTL Lv  4 SAB           3 Para           2 Para           1 Para             Past Medical History:  Diagnosis Date  . Acid reflux 03/15/2015  . Adaptation reaction  03/15/2015  . Dermatitis, eczematoid 03/15/2015   hand   . Family history of Alzheimer's disease 03/15/2015  . Impingement syndrome of shoulder 08/11/2002   Injected per Ortho   . Patella-femoral syndrome 04/25/2010   treated by Dr Cipriano Mile   . Post menopausal syndrome 03/15/2015  . Skin lesion 09/28/2003   Lesion on right chin removed by Dr Debe Coder Clarcke-Compound nevus   . Stress incontinence 01/05/2009   Treated by Dr. Arlyn Leak     Past Surgical History:  Procedure Laterality Date  . ABDOMINAL HYSTERECTOMY  2007  . CESAREAN SECTION  04/26/92; 01/15/96; 12/18/99  . DILATION AND CURETTAGE OF UTERUS     x's 2  . KNEE SURGERY Left   . NASAL SINUS SURGERY      Current Outpatient Medications on File Prior to Visit  Medication Sig Dispense Refill  . Biotin 10 MG CAPS Take by mouth.    . Phosphatidylserine-DHA-EPA (VAYACOG) 100-19.5-6.5 MG CAPS Take 1 capsule by mouth daily.    Marland Kitchen PREMARIN 1.25 MG tablet TAKE 2 TABLETS BY MOUTH DAILY. 180 tablet 3  . sertraline (ZOLOFT) 100 MG tablet Take 1 tablet (100 mg total) by mouth daily. Must be manufactured by [employer]. Patient does not tolerate medication by other manufacturers. 30 tablet 3   No current facility-administered medications on file prior to visit.  No Known Allergies  Social History   Socioeconomic History  . Marital status: Married    Spouse name: Not on file  . Number of children: Not on file  . Years of education: Not on file  . Highest education level: Not on file  Occupational History  . Not on file  Social Needs  . Financial resource strain: Not on file  . Food insecurity:    Worry: Not on file    Inability: Not on file  . Transportation needs:    Medical: Not on file    Non-medical: Not on file  Tobacco Use  . Smoking status: Never Smoker  . Smokeless tobacco: Never Used  Substance and Sexual Activity  . Alcohol use: Yes    Alcohol/week: 2.0 standard drinks    Types: 2 Glasses of wine per week  . Drug  use: No  . Sexual activity: Yes    Birth control/protection: Surgical  Lifestyle  . Physical activity:    Days per week: Not on file    Minutes per session: Not on file  . Stress: Not on file  Relationships  . Social connections:    Talks on phone: Not on file    Gets together: Not on file    Attends religious service: Not on file    Active member of club or organization: Not on file    Attends meetings of clubs or organizations: Not on file    Relationship status: Not on file  . Intimate partner violence:    Fear of current or ex partner: Not on file    Emotionally abused: Not on file    Physically abused: Not on file    Forced sexual activity: Not on file  Other Topics Concern  . Not on file  Social History Narrative  . Not on file    Family History  Problem Relation Age of Onset  . Dementia Mother   . Osteoarthritis Mother   . Hypertension Mother   . Hypertension Father   . Osteoarthritis Father   . Healthy Sister   . Healthy Sister   . Kidney cancer Neg Hx   . Breast cancer Neg Hx     The following portions of the patient's history were reviewed and updated as appropriate: allergies, current medications, past family history, past medical history, past social history, past surgical history and problem list.  Review of Systems Review of Systems  Constitutional:       No vasomotor symptoms on ERT  Respiratory: Negative.   Cardiovascular: Negative.   Gastrointestinal: Negative.   Genitourinary:       Stress incontinence persists  Musculoskeletal: Negative.   Neurological: Negative.   Psychiatric/Behavioral: The patient is nervous/anxious.   All other systems reviewed and are negative.   Objective:   BP 128/75   Pulse 69   Ht 5\' 5"  (1.651 m)   Wt 123 lb 1.6 oz (55.8 kg)   BMI 20.48 kg/m  CONSTITUTIONAL: Well-developed, well-nourished female in no acute distress.  PSYCHIATRIC: Normal mood and affect. Normal behavior. Normal judgment and thought  content. Peters: Alert and oriented to person, place, and time. Normal muscle tone coordination. No cranial nerve deficit noted. HENT:  Normocephalic, atraumatic, External right and left ear normal.  EYES: Conjunctivae and EOM are normal. No scleral icterus.  NECK: Normal range of motion, supple, no masses.  Normal thyroid.  SKIN: Skin is warm and dry. No rash noted. Not diaphoretic. No erythema. No pallor. CARDIOVASCULAR: Normal heart rate  noted, regular rhythm, no murmur. RESPIRATORY: Clear to auscultation bilaterally. Effort and breath sounds normal, no problems with respiration noted. BREASTS: Symmetric in size. No masses, skin changes, nipple drainage, or lymphadenopathy. ABDOMEN: Soft, normal bowel sounds, no distention noted.  No tenderness, rebound or guarding.  BLADDER: Normal PELVIC:  External Genitalia: Normal  BUS: Normal  Vagina: Normal estrogen effect; first-degree cystocele; no rectocele  Cervix: Normal; no cervical motion tenderness; well supported  Uterus: Surgically absent  Adnexa: Nonpalpable and nontender  RV: External Exam NormaI, No Rectal Masses and Normal Sphincter tone  MUSCULOSKELETAL: Normal range of motion. No tenderness.  No cyanosis, clubbing, or edema.  2+ distal pulses. LYMPHATIC: No Axillary, Supraclavicular, or Inguinal Adenopathy.    Assessment:   Annual gynecologic examination 52 y.o. Contraception: status post hysterectomy status post LSH Normal BMI Stress urinary incontinence, unsuccessful pessary trial Decreased libido associated with menopause  Plan:  Pap: Due 2021 Mammogram: Ordered Stool Guaiac Testing:   Labs: lipid a1c fbs tsh Routine preventative health maintenance measures emphasized: Exercise/Diet/Weight control, Tobacco Warnings, Alcohol/Substance use risks and Stress Management Recommend trial of poise impressa bladder supports Trial ofVyleese for decreased libido Return to Home acting as scribe for Dr. Enzo Bi. I have reviewed, updated, and concur with information scribed. Brayton Mars, MD  Note: This dictation was prepared with Dragon dictation along with smaller phrase technology. Any transcriptional errors that result from this process are unintentional.

## 2018-04-17 ENCOUNTER — Ambulatory Visit (INDEPENDENT_AMBULATORY_CARE_PROVIDER_SITE_OTHER): Payer: 59 | Admitting: Obstetrics and Gynecology

## 2018-04-17 ENCOUNTER — Encounter: Payer: Self-pay | Admitting: Obstetrics and Gynecology

## 2018-04-17 VITALS — BP 128/75 | HR 69 | Ht 65.0 in | Wt 123.1 lb

## 2018-04-17 DIAGNOSIS — R6882 Decreased libido: Secondary | ICD-10-CM

## 2018-04-17 DIAGNOSIS — N393 Stress incontinence (female) (male): Secondary | ICD-10-CM | POA: Diagnosis not present

## 2018-04-17 DIAGNOSIS — Z1239 Encounter for other screening for malignant neoplasm of breast: Secondary | ICD-10-CM | POA: Diagnosis not present

## 2018-04-17 DIAGNOSIS — Z0000 Encounter for general adult medical examination without abnormal findings: Secondary | ICD-10-CM

## 2018-04-17 DIAGNOSIS — Z1211 Encounter for screening for malignant neoplasm of colon: Secondary | ICD-10-CM | POA: Diagnosis not present

## 2018-04-17 DIAGNOSIS — N951 Menopausal and female climacteric states: Secondary | ICD-10-CM

## 2018-04-17 DIAGNOSIS — Z90710 Acquired absence of both cervix and uterus: Secondary | ICD-10-CM | POA: Diagnosis not present

## 2018-04-17 NOTE — Patient Instructions (Addendum)
1.  No Pap smear is done.  Next Pap smear is due 2021 2.  Mammogram is ordered. 3.  Stool guaiac cards are given for colon cancer screening 4.  Screening labs are ordered. 5.  Premarin 1.25 mg is refilled for 1 year. 6.  Calcium 600 mg twice a day and vitamin D 400 international units twice a day as recommended 7.  Continue with healthy eating, exercise. 8.  Trial of Vyleesi for decreased libido-prescription given with refills. 9.  Recommend trial of poise impressa bladder supports for stress incontinence 10.  Return in 1 year for annual exam-Dr. [place] Maintenance for Postmenopausal Women Menopause is a normal process in which your reproductive ability comes to an end. This process happens gradually over a span of months to years, usually between the ages of 69 and 49. Menopause is complete when you have missed 12 consecutive menstrual periods. It is important to talk with your health care provider about some of the most common conditions that affect postmenopausal women, such as heart disease, cancer, and bone loss (osteoporosis). Adopting a healthy lifestyle and getting preventive care can help to promote your health and wellness. Those actions can also lower your chances of developing some of these common conditions. What should I know about menopause? During menopause, you may experience a number of symptoms, such as:  Moderate-to-severe hot flashes.  Night sweats.  Decrease in sex drive.  Mood swings.  Headaches.  Tiredness.  Irritability.  Memory problems.  Insomnia.  Choosing to treat or not to treat menopausal changes is an individual decision that you make with your health care provider. What should I know about hormone replacement therapy and supplements? Hormone therapy products are effective for treating symptoms that are associated with menopause, such as hot flashes and night sweats. Hormone replacement carries certain risks, especially as you become  older. If you are thinking about using estrogen or estrogen with progestin treatments, discuss the benefits and risks with your health care provider. What should I know about heart disease and stroke? Heart disease, heart attack, and stroke become more likely as you age. This may be due, in part, to the hormonal changes that your body experiences during menopause. These can affect how your body processes dietary fats, triglycerides, and cholesterol. Heart attack and stroke are both medical emergencies. There are many things that you can do to help prevent heart disease and stroke:  Have your blood pressure checked at least every 1-2 years. High blood pressure causes heart disease and increases the risk of stroke.  If you are 56-28 years old, ask your health care provider if you should take aspirin to prevent a heart attack or a stroke.  Do not use any tobacco products, including cigarettes, chewing tobacco, or electronic cigarettes. If you need help quitting, ask your health care provider.  It is important to eat a healthy diet and maintain a healthy weight. ? Be sure to include plenty of vegetables, fruits, low-fat dairy products, and lean protein. ? Avoid eating foods that are high in solid fats, added sugars, or salt (sodium).  Get regular exercise. This is one of the most important things that you can do for your health. ? Try to exercise for at least 150 minutes each week. The type of exercise that you do should increase your heart rate and make you sweat. This is known as moderate-intensity exercise. ? Try to do strengthening exercises at least twice each week. Do these in addition to  the moderate-intensity exercise.  Know your numbers.Ask your health care provider to check your cholesterol and your blood glucose. Continue to have your blood tested as directed by your health care provider.  What should I know about cancer screening? There are several types of cancer. Take the following  steps to reduce your risk and to catch any cancer development as early as possible. Breast Cancer  Practice breast self-awareness. ? This means understanding how your breasts normally appear and feel. ? It also means doing regular breast self-exams. Let your health care provider know about any changes, no matter how small.  If you are 70 or older, have a clinician do a breast exam (clinical breast exam or CBE) every year. Depending on your age, family history, and medical history, it may be recommended that you also have a yearly breast X-ray (mammogram).  If you have a family history of breast cancer, talk with your health care provider about genetic screening.  If you are at high risk for breast cancer, talk with your health care provider about having an MRI and a mammogram every year.  Breast cancer (BRCA) gene test is recommended for women who have family members with BRCA-related cancers. Results of the assessment will determine the need for genetic counseling and BRCA1 and for BRCA2 testing. BRCA-related cancers include these types: ? Breast. This occurs in males or females. ? Ovarian. ? Tubal. This may also be called fallopian tube cancer. ? Cancer of the abdominal or pelvic lining (peritoneal cancer). ? Prostate. ? Pancreatic.  Cervical, Uterine, and Ovarian Cancer Your health care provider may recommend that you be screened regularly for cancer of the pelvic organs. These include your ovaries, uterus, and vagina. This screening involves a pelvic exam, which includes checking for microscopic changes to the surface of your cervix (Pap test).  For women ages 21-65, health care providers may recommend a pelvic exam and a Pap test every three years. For women ages 7-65, they may recommend the Pap test and pelvic exam, combined with testing for human papilloma virus (HPV), every five years. Some types of HPV increase your risk of cervical cancer. Testing for HPV may also be done on women  of any age who have unclear Pap test results.  Other health care providers may not recommend any screening for nonpregnant women who are considered low risk for pelvic cancer and have no symptoms. Ask your health care provider if a screening pelvic exam is right for you.  If you have had past treatment for cervical cancer or a condition that could lead to cancer, you need Pap tests and screening for cancer for at least 20 years after your treatment. If Pap tests have been discontinued for you, your risk factors (such as having a new sexual partner) need to be reassessed to determine if you should start having screenings again. Some women have medical problems that increase the chance of getting cervical cancer. In these cases, your health care provider may recommend that you have screening and Pap tests more often.  If you have a family history of uterine cancer or ovarian cancer, talk with your health care provider about genetic screening.  If you have vaginal bleeding after reaching menopause, tell your health care provider.  There are currently no reliable tests available to screen for ovarian cancer.  Lung Cancer Lung cancer screening is recommended for adults 26-45 years old who are at high risk for lung cancer because of a history of smoking. A yearly low-dose  CT scan of the lungs is recommended if you:  Currently smoke.  Have a history of at least 30 pack-years of smoking and you currently smoke or have quit within the past 15 years. A pack-year is smoking an average of one pack of cigarettes per day for one year.  Yearly screening should:  Continue until it has been 15 years since you quit.  Stop if you develop a health problem that would prevent you from having lung cancer treatment.  Colorectal Cancer  This type of cancer can be detected and can often be prevented.  Routine colorectal cancer screening usually begins at age 63 and continues through age 52.  If you have risk  factors for colon cancer, your health care provider may recommend that you be screened at an earlier age.  If you have a family history of colorectal cancer, talk with your health care provider about genetic screening.  Your health care provider may also recommend using home test kits to check for hidden blood in your stool.  A small camera at the end of a tube can be used to examine your colon directly (sigmoidoscopy or colonoscopy). This is done to check for the earliest forms of colorectal cancer.  Direct examination of the colon should be repeated every 5-10 years until age 74. However, if early forms of precancerous polyps or small growths are found or if you have a family history or genetic risk for colorectal cancer, you may need to be screened more often.  Skin Cancer  Check your skin from head to toe regularly.  Monitor any moles. Be sure to tell your health care provider: ? About any new moles or changes in moles, especially if there is a change in a mole's shape or color. ? If you have a mole that is larger than the size of a pencil eraser.  If any of your family members has a history of skin cancer, especially at a young age, talk with your health care provider about genetic screening.  Always use sunscreen. Apply sunscreen liberally and repeatedly throughout the day.  Whenever you are outside, protect yourself by wearing long sleeves, pants, a wide-brimmed hat, and sunglasses.  What should I know about osteoporosis? Osteoporosis is a condition in which bone destruction happens more quickly than new bone creation. After menopause, you may be at an increased risk for osteoporosis. To help prevent osteoporosis or the bone fractures that can happen because of osteoporosis, the following is recommended:  If you are 71-54 years old, get at least 1,000 mg of calcium and at least 600 mg of vitamin D per day.  If you are older than age 42 but younger than age 4, get at least 1,200  mg of calcium and at least 600 mg of vitamin D per day.  If you are older than age 101, get at least 1,200 mg of calcium and at least 800 mg of vitamin D per day.  Smoking and excessive alcohol intake increase the risk of osteoporosis. Eat foods that are rich in calcium and vitamin D, and do weight-bearing exercises several times each week as directed by your health care provider. What should I know about how menopause affects my mental health? Depression may occur at any age, but it is more common as you become older. Common symptoms of depression include:  Low or sad mood.  Changes in sleep patterns.  Changes in appetite or eating patterns.  Feeling an overall lack of motivation or enjoyment of  activities that you previously enjoyed.  Frequent crying spells.  Talk with your health care provider if you think that you are experiencing depression. What should I know about immunizations? It is important that you get and maintain your immunizations. These include:  Tetanus, diphtheria, and pertussis (Tdap) booster vaccine.  Influenza every year before the flu season begins.  Pneumonia vaccine.  Shingles vaccine.  Your health care provider may also recommend other immunizations. This information is not intended to replace advice given to you by your health care provider. Make sure you discuss any questions you have with your health care provider. Document Released: 06/16/2005 Document Revised: 11/12/2015 Document Reviewed: 01/26/2015 Elsevier Interactive Patient Education  2018 [employer].

## 2018-04-18 LAB — TSH: TSH: 3.28 u[IU]/mL (ref 0.450–4.500)

## 2018-04-18 LAB — LIPID PANEL
CHOL/HDL RATIO: 2.9 ratio (ref 0.0–4.4)
Cholesterol, Total: 203 mg/dL — ABNORMAL HIGH (ref 100–199)
HDL: 71 mg/dL (ref >39)
LDL Calculated: 100 mg/dL — ABNORMAL HIGH (ref 0–99)
Triglycerides: 158 mg/dL — ABNORMAL HIGH (ref 0–149)
VLDL CHOLESTEROL CAL: 32 mg/dL (ref 5–40)

## 2018-04-18 LAB — GLUCOSE, RANDOM: Glucose: 94 mg/dL (ref 65–99)

## 2018-04-18 LAB — HEMOGLOBIN A1C
ESTIMATED AVERAGE GLUCOSE: 117 mg/dL
HEMOGLOBIN A1C: 5.7 % — AB (ref 4.8–5.6)

## 2018-04-29 ENCOUNTER — Other Ambulatory Visit: Payer: Self-pay | Admitting: Family Medicine

## 2018-04-29 MED ORDER — SERTRALINE HCL 100 MG PO TABS: 100.0000 mg | ORAL_TABLET | Freq: Every day | ORAL | 2 refills | Status: DC

## 2018-07-06 ENCOUNTER — Other Ambulatory Visit: Payer: Self-pay | Admitting: Family Medicine

## 2018-07-06 MED ORDER — AMOXICILLIN 875 MG PO TABS: 875.0000 mg | ORAL_TABLET | Freq: Two times a day (BID) | ORAL | 0 refills | Status: AC

## 2018-09-23 ENCOUNTER — Other Ambulatory Visit: Payer: Self-pay | Admitting: Family Medicine

## 2018-09-23 MED ORDER — SULFAMETHOXAZOLE-TRIMETHOPRIM 800-160 MG PO TABS: 1.0000 | ORAL_TABLET | Freq: Two times a day (BID) | ORAL | 0 refills | Status: AC

## 2018-09-23 NOTE — Progress Notes (Unsigned)
Patient sent message that she is having UTI symptoms and requests prescription for Bactrim DS sent to [facility], [place]

## 2018-12-19 ENCOUNTER — Telehealth: Payer: Self-pay

## 2018-12-19 DIAGNOSIS — D649 Anemia, unspecified: Secondary | ICD-10-CM

## 2018-12-19 DIAGNOSIS — R5383 Other fatigue: Secondary | ICD-10-CM

## 2018-12-19 NOTE — Telephone Encounter (Signed)
Order for CBC secondary to Fatigue and Anemia.  Thanks,   -Mickel Baas

## 2018-12-20 DIAGNOSIS — D649 Anemia, unspecified: Secondary | ICD-10-CM | POA: Diagnosis not present

## 2018-12-20 DIAGNOSIS — R5383 Other fatigue: Secondary | ICD-10-CM | POA: Diagnosis not present

## 2018-12-21 LAB — CBC WITH DIFFERENTIAL/PLATELET
Basophils Absolute: 0.1 10*3/uL (ref 0.0–0.2)
Basos: 1 %
EOS (ABSOLUTE): 0.1 10*3/uL (ref 0.0–0.4)
Eos: 2 %
Hematocrit: 34.4 % (ref 34.0–46.6)
Hemoglobin: 11.1 g/dL (ref 11.1–15.9)
Immature Grans (Abs): 0 10*3/uL (ref 0.0–0.1)
Immature Granulocytes: 0 %
Lymphocytes Absolute: 2.2 10*3/uL (ref 0.7–3.1)
Lymphs: 32 %
MCH: 29.1 pg (ref 26.6–33.0)
MCHC: 32.3 g/dL (ref 31.5–35.7)
MCV: 90 fL (ref 79–97)
Monocytes Absolute: 0.6 10*3/uL (ref 0.1–0.9)
Monocytes: 9 %
Neutrophils Absolute: 3.8 10*3/uL (ref 1.4–7.0)
Neutrophils: 56 %
Platelets: 254 10*3/uL (ref 150–450)
RBC: 3.82 x10E6/uL (ref 3.77–5.28)
RDW: 12.8 % (ref 11.7–15.4)
WBC: 6.8 10*3/uL (ref 3.4–10.8)

## 2019-01-14 ENCOUNTER — Other Ambulatory Visit: Payer: Self-pay | Admitting: Family Medicine

## 2019-01-14 ENCOUNTER — Encounter: Payer: Self-pay | Admitting: Family Medicine

## 2019-01-14 DIAGNOSIS — K219 Gastro-esophageal reflux disease without esophagitis: Secondary | ICD-10-CM

## 2019-01-14 MED ORDER — OMEPRAZOLE 20 MG PO CPDR: 20.0000 mg | DELAYED_RELEASE_CAPSULE | Freq: Two times a day (BID) | ORAL | 0 refills | Status: DC

## 2019-01-14 NOTE — Progress Notes (Signed)
Advised by Dr. Rosanna Randy that patient started having pain, burning, spasm in epigastrium and chest yesterday similar to previously episodes of esophagitis. Started on omeprazole and feeling better today. Requested omeprazole 20mg  bid and to make o.v. if  Not continuing to steadily improve.

## 2019-01-15 ENCOUNTER — Other Ambulatory Visit: Payer: Self-pay

## 2019-01-15 DIAGNOSIS — R6889 Other general symptoms and signs: Secondary | ICD-10-CM | POA: Diagnosis not present

## 2019-01-15 DIAGNOSIS — R519 Headache, unspecified: Secondary | ICD-10-CM

## 2019-01-15 DIAGNOSIS — Z20822 Contact with and (suspected) exposure to covid-19: Secondary | ICD-10-CM

## 2019-01-16 LAB — NOVEL CORONAVIRUS, NAA: SARS-CoV-2, NAA: NOT DETECTED

## 2019-01-19 IMAGING — CR DG CHEST 2V
1 series · 2 of 2 positions shown · non-contrast
Comparison: None.

CLINICAL DATA: Cough for 3 weeks.

EXAM:
CHEST  2 VIEW

[Series 1: dg chest 2 view · 0.14mm/px · 2 of 2 slices shown]
[im 1/2]
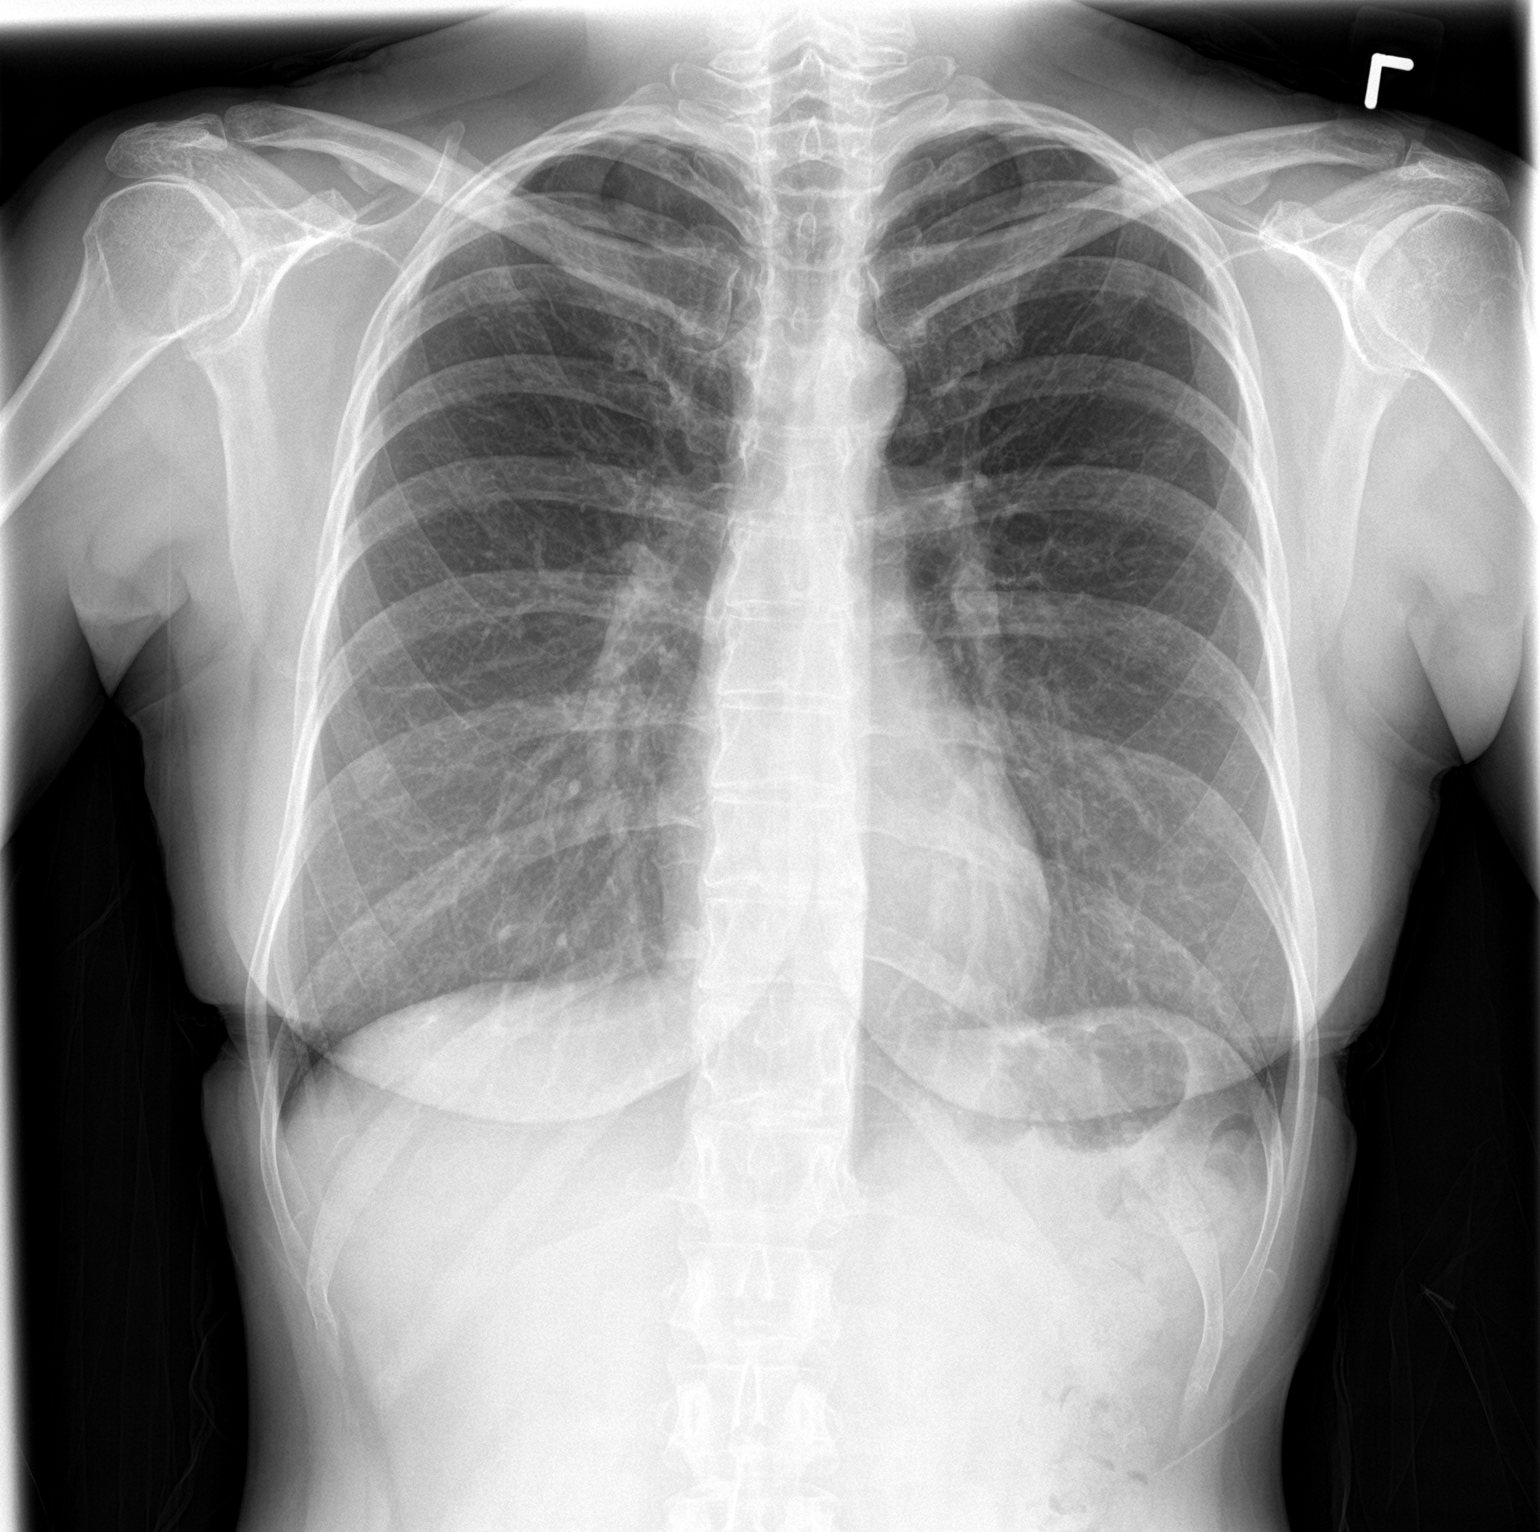
[im 2/2]
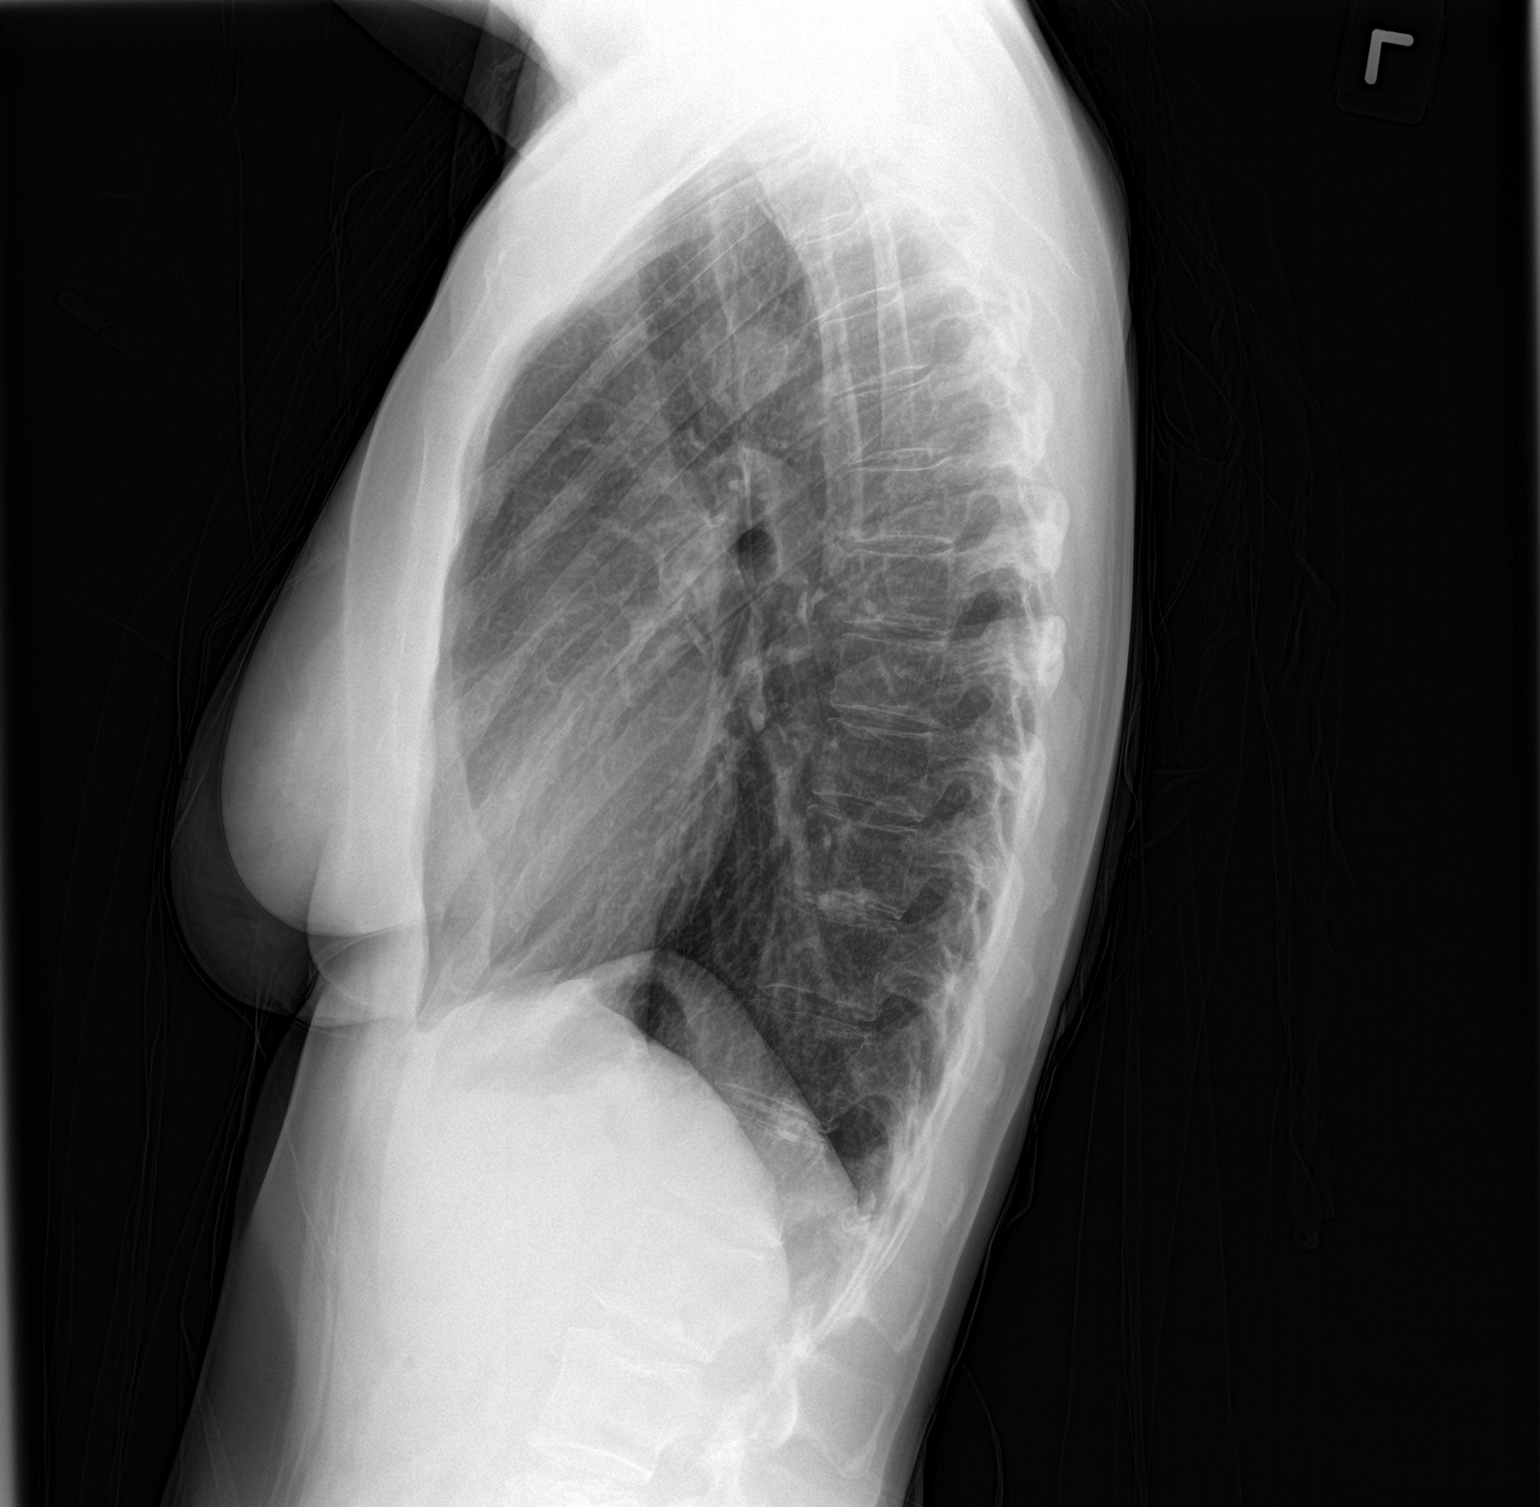

[2 of 2 positions shown; findings below may reference images not displayed]

FINDINGS: The heart size and mediastinal contours are within normal limits.
Both lungs are clear. No pneumothorax or pleural effusion is noted.
The visualized skeletal structures are unremarkable.
IMPRESSION: No active cardiopulmonary disease.

## 2019-01-24 ENCOUNTER — Ambulatory Visit: Payer: Self-pay | Admitting: Family Medicine

## 2019-01-29 DIAGNOSIS — H02839 Dermatochalasis of unspecified eye, unspecified eyelid: Secondary | ICD-10-CM | POA: Diagnosis not present

## 2019-01-29 DIAGNOSIS — H5213 Myopia, bilateral: Secondary | ICD-10-CM | POA: Diagnosis not present

## 2019-02-03 ENCOUNTER — Ambulatory Visit: Payer: Self-pay | Admitting: Family Medicine

## 2019-02-18 ENCOUNTER — Other Ambulatory Visit: Payer: Self-pay

## 2019-02-18 ENCOUNTER — Ambulatory Visit (INDEPENDENT_AMBULATORY_CARE_PROVIDER_SITE_OTHER): Payer: 59 | Admitting: Family Medicine

## 2019-02-18 ENCOUNTER — Encounter: Payer: Self-pay | Admitting: Family Medicine

## 2019-02-18 VITALS — BP 110/60 | HR 66 | Resp 16

## 2019-02-18 DIAGNOSIS — Z8711 Personal history of peptic ulcer disease: Secondary | ICD-10-CM

## 2019-02-18 DIAGNOSIS — Z23 Encounter for immunization: Secondary | ICD-10-CM | POA: Diagnosis not present

## 2019-02-18 DIAGNOSIS — K219 Gastro-esophageal reflux disease without esophagitis: Secondary | ICD-10-CM | POA: Diagnosis not present

## 2019-02-18 DIAGNOSIS — Z8719 Personal history of other diseases of the digestive system: Secondary | ICD-10-CM | POA: Diagnosis not present

## 2019-02-18 MED ORDER — OMEPRAZOLE 20 MG PO CPDR: 20.0000 mg | DELAYED_RELEASE_CAPSULE | Freq: Every day | ORAL | 3 refills | Status: AC

## 2019-02-18 NOTE — Progress Notes (Signed)
Patient: Alyssa Humphrey Female    DOB: [birth year]   53 y.o.   MRN: [id number] Visit Date: 02/18/2019   Today's Provider: Lelon Huh, MD   Chief Complaint  Patient presents with  . Gastroesophageal Reflux   Subjective:     HPI  Follow up for Esophagitis:  Patient had episode of moderate chest pain and pain with swallowing around 01-14-2019 which was similar to reflux symptoms she experienced several years ago. No hematemesis or melena. Previous episode was treated PPI which she took for several months and did not have any significant symptoms again until episode in September. She was started on Omeprazole 20mg  twice daily on 01/14/2019 and reports she felt much better within a few days and has not had any recurrent symptoms. She has since reduced omeprazole to one tablet daily and is tolerating well with no adverse effects.    ------------------------------------------------------------------------------------   No Known Allergies   Current Outpatient Medications:  .  omeprazole (PRILOSEC) 20 MG capsule, Take 1 capsule (20 mg total) by mouth 2 (two) times daily before a meal., Disp: 60 capsule, Rfl: 0 .  Phosphatidylserine-DHA-EPA (VAYACOG) 100-19.5-6.5 MG CAPS, Take 1 capsule by mouth daily., Disp: , Rfl:  .  PREMARIN 1.25 MG tablet, TAKE 2 TABLETS BY MOUTH DAILY., Disp: 180 tablet, Rfl: 3 .  sertraline (ZOLOFT) 100 MG tablet, Take 1 tablet (100 mg total) by mouth daily. Must be manufactured by [employer]. Patient does not tolerate medication by other manufacturers., Disp: 90 tablet, Rfl: 2  Review of Systems  Constitutional: Negative for appetite change, chills, fatigue and fever.  Respiratory: Negative for chest tightness and shortness of breath.   Cardiovascular: Negative for chest pain and palpitations.  Gastrointestinal: Negative for abdominal pain, nausea and vomiting.  Neurological: Negative for dizziness and weakness.    Social History   Tobacco Use  .  Smoking status: Never Smoker  . Smokeless tobacco: Never Used  Substance Use Topics  . Alcohol use: Yes    Alcohol/week: 2.0 standard drinks    Types: 2 Glasses of wine per week      Objective:   BP 110/60 (BP Location: Left Arm, Patient Position: Sitting, Cuff Size: Normal)   Pulse 66   Resp 16   SpO2 98% Comment: room air Vitals:   02/18/19 1445  BP: 110/60  Pulse: 66  Resp: 16  SpO2: 98%  There is no height or weight on file to calculate BMI.   Physical Exam  General appearance: Well developed, well nourished female, cooperative and in no acute distress Respiratory: Respirations even and unlabored, normal respiratory rate Skin: Skin color, texture, turgor normal. No rashes seen  Psych: Appropriate mood and affect.      Assessment & Plan    1. Gastroesophageal reflux disease, unspecified whether esophagitis present  - omeprazole (PRILOSEC) 20 MG capsule; Take 1 capsule (20 mg total) by mouth daily.  Dispense: 90 capsule; Refill: 3  2. Personal history of gastric ulcer  Symptoms from last month very similar to previous symptoms, which I believe was when she was found to have small gastric ulcer in 2002. Symptoms have now completely resolved. Discussed returning to GI for EGD versus continued therapy with PPI. She prefers not to do EGD unless absolutely necessary. For now will plan on staying on PPI which, considering the severity of her symptoms, will anticipated continuing for the next 12 months if not indefinitely. If she has any recurrence of sx will refer to  GI.   3. Need for influenza vaccination  - Flu Vaccine QUAD 6+ mos PF IM (Fluarix Quad PF)     Lelon Huh, MD  [facility]

## 2019-02-28 DIAGNOSIS — Z8711 Personal history of peptic ulcer disease: Secondary | ICD-10-CM | POA: Insufficient documentation

## 2019-02-28 DIAGNOSIS — Z8719 Personal history of other diseases of the digestive system: Secondary | ICD-10-CM | POA: Insufficient documentation

## 2019-02-28 NOTE — Patient Instructions (Signed)
.   Please review the attached list of medications and notify my office if there are any errors.   . Please bring all of your medications to every appointment so we can make sure that our medication list is the same as yours.   . It is especially important to get the annual flu vaccine this year. If you haven't had it already, please go to your pharmacy or call the office as soon as possible to schedule you flu shot.  

## 2019-05-15 ENCOUNTER — Ambulatory Visit: Payer: 59 | Attending: Internal Medicine

## 2019-05-15 DIAGNOSIS — Z20822 Contact with and (suspected) exposure to covid-19: Secondary | ICD-10-CM | POA: Diagnosis not present

## 2019-05-17 LAB — NOVEL CORONAVIRUS, NAA: SARS-CoV-2, NAA: NOT DETECTED

## 2019-05-22 ENCOUNTER — Ambulatory Visit: Payer: 59 | Attending: Internal Medicine

## 2019-05-22 DIAGNOSIS — Z20822 Contact with and (suspected) exposure to covid-19: Secondary | ICD-10-CM | POA: Diagnosis not present

## 2019-05-23 LAB — NOVEL CORONAVIRUS, NAA: SARS-CoV-2, NAA: DETECTED — AB

## 2019-05-26 ENCOUNTER — Encounter: Payer: Self-pay | Admitting: *Deleted

## 2019-06-23 ENCOUNTER — Other Ambulatory Visit: Payer: Self-pay | Admitting: Family Medicine

## 2019-06-30 ENCOUNTER — Other Ambulatory Visit: Payer: Self-pay | Admitting: Family Medicine

## 2019-06-30 MED ORDER — CILIDINIUM-CHLORDIAZEPOXIDE 2.5-5 MG PO CAPS: 1.0000 | ORAL_CAPSULE | Freq: Two times a day (BID) | ORAL | 1 refills | Status: DC | PRN

## 2019-06-30 NOTE — Progress Notes (Signed)
Refill librax for IBS originally prescription by Dr. Venia Minks in 2010

## 2019-11-18 ENCOUNTER — Other Ambulatory Visit: Payer: Self-pay | Admitting: Family Medicine

## 2019-11-18 MED ORDER — ESTROGENS CONJUGATED 1.25 MG PO TABS: 2.5000 mg | ORAL_TABLET | Freq: Every day | ORAL | 3 refills | Status: DC

## 2019-11-18 MED ORDER — CILIDINIUM-CHLORDIAZEPOXIDE 2.5-5 MG PO CAPS: 1.0000 | ORAL_CAPSULE | Freq: Two times a day (BID) | ORAL | 1 refills | Status: DC | PRN

## 2020-01-23 ENCOUNTER — Other Ambulatory Visit: Payer: Self-pay | Admitting: Family Medicine

## 2020-01-29 ENCOUNTER — Other Ambulatory Visit: Payer: Self-pay | Admitting: Family Medicine

## 2020-01-29 DIAGNOSIS — Z20822 Contact with and (suspected) exposure to covid-19: Secondary | ICD-10-CM

## 2020-01-31 LAB — NOVEL CORONAVIRUS, NAA: SARS-CoV-2, NAA: NOT DETECTED

## 2020-01-31 LAB — SPECIMEN STATUS REPORT

## 2020-01-31 LAB — SARS-COV-2, NAA 2 DAY TAT

## 2020-02-03 ENCOUNTER — Telehealth: Payer: Self-pay

## 2020-02-03 NOTE — Telephone Encounter (Signed)
-----   Message from Margo Common, [place] sent at 02/03/2020  9:02 AM EDT ----- Negative COVID test. Follow post exposure recommendations without symptoms.

## 2020-02-03 NOTE — Telephone Encounter (Signed)
Patient was advised.  

## 2020-04-13 DIAGNOSIS — H02839 Dermatochalasis of unspecified eye, unspecified eyelid: Secondary | ICD-10-CM | POA: Diagnosis not present

## 2020-04-13 DIAGNOSIS — H5213 Myopia, bilateral: Secondary | ICD-10-CM | POA: Diagnosis not present

## 2020-04-14 ENCOUNTER — Other Ambulatory Visit: Payer: Self-pay

## 2020-04-14 ENCOUNTER — Ambulatory Visit (INDEPENDENT_AMBULATORY_CARE_PROVIDER_SITE_OTHER): Payer: 59

## 2020-04-14 DIAGNOSIS — Z23 Encounter for immunization: Secondary | ICD-10-CM

## 2020-06-04 ENCOUNTER — Other Ambulatory Visit: Payer: Self-pay

## 2020-06-04 DIAGNOSIS — J069 Acute upper respiratory infection, unspecified: Secondary | ICD-10-CM | POA: Diagnosis not present

## 2020-06-04 NOTE — Progress Notes (Signed)
Ok per Dr. Caryn Section.

## 2020-06-05 LAB — COVID-19, FLU A+B NAA
Influenza A, NAA: NOT DETECTED
Influenza B, NAA: NOT DETECTED
SARS-CoV-2, NAA: NOT DETECTED

## 2020-08-13 ENCOUNTER — Telehealth: Payer: Self-pay | Admitting: Family Medicine

## 2020-08-13 DIAGNOSIS — N393 Stress incontinence (female) (male): Secondary | ICD-10-CM

## 2020-08-13 NOTE — Telephone Encounter (Signed)
Dr Rosanna Randy called requesting that pt be referred to Dr Matilde Sprang for Stress incontinence,Thanks

## 2020-08-16 NOTE — Telephone Encounter (Signed)
done

## 2020-08-17 ENCOUNTER — Other Ambulatory Visit: Payer: Self-pay | Admitting: Family Medicine

## 2020-08-17 ENCOUNTER — Other Ambulatory Visit: Payer: Self-pay

## 2020-08-17 MED ORDER — SERTRALINE HCL 100 MG PO TABS
ORAL_TABLET | ORAL | 1 refills | Status: DC
  Filled 2020-08-17 – 2020-08-18 (×2): qty 90, 90d supply, fill #0
  Filled 2021-01-26: qty 90, 90d supply, fill #1

## 2020-08-18 ENCOUNTER — Other Ambulatory Visit: Payer: Self-pay

## 2020-08-19 ENCOUNTER — Other Ambulatory Visit: Payer: Self-pay

## 2020-08-25 ENCOUNTER — Other Ambulatory Visit: Payer: Self-pay

## 2020-08-25 ENCOUNTER — Telehealth: Payer: Self-pay

## 2020-08-25 DIAGNOSIS — H1030 Unspecified acute conjunctivitis, unspecified eye: Secondary | ICD-10-CM

## 2020-08-25 MED ORDER — CIPROFLOXACIN HCL 0.3 % OP SOLN
1.0000 [drp] | OPHTHALMIC | 0 refills | Status: AC
  Filled 2020-08-25: qty 5, 10d supply, fill #0

## 2020-08-25 NOTE — Telephone Encounter (Signed)
Patient complains of stye and conjunctivitis.  Would like Rx sent to [facility]

## 2020-09-08 ENCOUNTER — Other Ambulatory Visit: Payer: Self-pay | Admitting: Family Medicine

## 2020-09-08 ENCOUNTER — Other Ambulatory Visit: Payer: Self-pay

## 2020-09-08 MED ORDER — CILIDINIUM-CHLORDIAZEPOXIDE 2.5-5 MG PO CAPS
ORAL_CAPSULE | ORAL | 1 refills | Status: DC
  Filled 2020-09-08: qty 60, 30d supply, fill #0
  Filled 2021-01-26: qty 60, 30d supply, fill #1

## 2020-09-09 ENCOUNTER — Other Ambulatory Visit: Payer: Self-pay

## 2020-09-10 ENCOUNTER — Other Ambulatory Visit: Payer: Self-pay

## 2020-09-13 DIAGNOSIS — Z20822 Contact with and (suspected) exposure to covid-19: Secondary | ICD-10-CM | POA: Diagnosis not present

## 2020-09-13 DIAGNOSIS — Z03818 Encounter for observation for suspected exposure to other biological agents ruled out: Secondary | ICD-10-CM | POA: Diagnosis not present

## 2020-10-18 ENCOUNTER — Other Ambulatory Visit: Payer: Self-pay

## 2020-10-18 ENCOUNTER — Ambulatory Visit: Payer: 59 | Admitting: Family Medicine

## 2020-10-18 VITALS — BP 96/65 | HR 65 | Ht 65.0 in | Wt 115.0 lb

## 2020-10-18 DIAGNOSIS — Z1211 Encounter for screening for malignant neoplasm of colon: Secondary | ICD-10-CM

## 2020-10-18 DIAGNOSIS — F4329 Adjustment disorder with other symptoms: Secondary | ICD-10-CM | POA: Diagnosis not present

## 2020-10-18 DIAGNOSIS — Z1159 Encounter for screening for other viral diseases: Secondary | ICD-10-CM

## 2020-10-18 DIAGNOSIS — K219 Gastro-esophageal reflux disease without esophagitis: Secondary | ICD-10-CM

## 2020-10-18 DIAGNOSIS — Z23 Encounter for immunization: Secondary | ICD-10-CM

## 2020-10-18 DIAGNOSIS — Z0000 Encounter for general adult medical examination without abnormal findings: Secondary | ICD-10-CM | POA: Diagnosis not present

## 2020-10-18 NOTE — Patient Instructions (Addendum)
Please review the attached list of medications and notify my office if there are any errors.   Please bring all of your medications to every appointment so we can make sure that our medication list is the same as yours.  Please call the [facility] at [facility] at [phone] to schedule your mammogram.  Call [facility] at

## 2020-10-18 NOTE — Progress Notes (Signed)
Complete physical exam   Patient: Alyssa Humphrey   DOB: 1966-02-19   55 y.o. Female  MRN: [id number] Visit Date: 10/18/2020  Today's healthcare provider: Lelon Huh, MD   No chief complaint on file.  Subjective    Alyssa Humphrey is a 55 y.o. female who presents today for a complete physical exam.  She reports consuming a general diet.  She generally feels well. She reports sleeping fairly well. She does not have additional problems to discuss today.  HPI  She feels that sertraline continues to work very well for anxiety and is well tolerating. Her reflux has been doing much better and only takes omeprazole briefly when she has a flare up, and it continues to work very well. She had been follow up Dr. Keturah Barre and [facility] who is no longer there and has not yet established with another gyn. Take Librax rarely for IBS symptoms which is well tolerated and effective.   Past Medical History:  Diagnosis Date   Acid reflux 03/15/2015   Adaptation reaction 03/15/2015   Dermatitis, eczematoid 03/15/2015   hand    Family history of Alzheimer's disease 03/15/2015   Impingement syndrome of shoulder 08/11/2002   Injected per Ortho    Patella-femoral syndrome 04/25/2010   treated by Dr Cipriano Mile    Post menopausal syndrome 03/15/2015   Skin lesion 09/28/2003   Lesion on right chin removed by Dr Debe Coder Clarcke-Compound nevus    Stress incontinence 01/05/2009   Treated by Dr. Arlyn Leak    Past Surgical History:  Procedure Laterality Date   ABDOMINAL HYSTERECTOMY  2007   CESAREAN SECTION  04/26/92; 01/15/96; 12/18/99   DILATION AND CURETTAGE OF UTERUS     x's 2   KNEE SURGERY Left    NASAL SINUS SURGERY     Social History   Socioeconomic History   Marital status: Married    Spouse name: Not on file   Number of children: Not on file   Years of education: Not on file   Highest education level: Not on file  Occupational History   Not on file  Tobacco Use   Smoking status: Never    Smokeless tobacco: Never  Vaping Use   Vaping Use: Never used  Substance and Sexual Activity   Alcohol use: Yes    Alcohol/week: 2.0 standard drinks    Types: 2 Glasses of wine per week   Drug use: No   Sexual activity: Yes    Birth control/protection: Surgical  Other Topics Concern   Not on file  Social History Narrative   Not on file   Social Determinants of Health   Financial Resource Strain: Not on file  Food Insecurity: Not on file  Transportation Needs: Not on file  Physical Activity: Not on file  Stress: Not on file  Social Connections: Not on file  Intimate Partner Violence: Not on file   Family Status  Relation Name Status   Mother  Alive   Father  Deceased at age 34   Sister  95   Sister  Alive   Neg Hx  (Not Specified)   Family History  Problem Relation Age of Onset   Dementia Mother    Osteoarthritis Mother    Hypertension Mother    Hypertension Father    Osteoarthritis Father    Healthy Sister    Healthy Sister    Kidney cancer Neg Hx    Breast cancer Neg Hx    No Known  Allergies  Patient Care Team: Birdie Sons, MD as PCP - General (Family Medicine) Anell Barr, OD (Optometry)   Medications: Outpatient Medications Prior to Visit  Medication Sig   clidinium-chlordiazePOXIDE (LIBRAX) 5-2.5 MG capsule TAKE 1 CAPSULE BY MOUTH 2 TIMES DAILY AS NEEDED.   estrogens, conjugated, (PREMARIN) 1.25 MG tablet Take 2 tablets (2.5 mg total) by mouth daily.   omeprazole (PRILOSEC) 20 MG capsule Take 1 capsule (20 mg total) by mouth daily.   sertraline (ZOLOFT) 100 MG tablet TAKE 1 TABLET BY MOUTH DAILY. MUST BE MANUFACTURED BY [employer]. PATIENT DOES NOT TOLERATE MEDICATION BY OTHER MANUFACTURERS.   Phosphatidylserine-DHA-EPA (VAYACOG) 100-19.5-6.5 MG CAPS Take 1 capsule by mouth daily.   No facility-administered medications prior to visit.    Review of Systems  Constitutional:  Negative for chills, fatigue and fever.  HENT:  Negative for  congestion, ear pain, rhinorrhea, sneezing and sore throat.   Eyes: Negative.  Negative for pain and redness.  Respiratory:  Negative for cough, shortness of breath and wheezing.   Cardiovascular:  Negative for chest pain and leg swelling.  Gastrointestinal:  Negative for abdominal pain, blood in stool, constipation, diarrhea and nausea.  Endocrine: Negative for polydipsia and polyphagia.  Genitourinary: Negative.  Negative for dysuria, flank pain, hematuria, pelvic pain, vaginal bleeding and vaginal discharge.  Musculoskeletal:  Negative for arthralgias, back pain, gait problem and joint swelling.  Skin:  Negative for rash.  Neurological: Negative.  Negative for dizziness, tremors, seizures, weakness, light-headedness, numbness and headaches.  Hematological:  Negative for adenopathy.  Psychiatric/Behavioral: Negative.  Negative for behavioral problems, confusion and dysphoric mood. The patient is not nervous/anxious and is not hyperactive.      Objective    BP 96/65 (BP Location: Right Arm, Patient Position: Sitting, Cuff Size: Normal)   Pulse 65   Ht 5\' 5"  (1.651 m)   Wt 115 lb (52.2 kg)   SpO2 98%   BMI 19.14 kg/m    Physical Exam   General Appearance:    Thin female. Alert, cooperative, in no acute distress, appears stated age   Head:    Normocephalic, without obvious abnormality, atraumatic  Eyes:    PERRL, conjunctiva/corneas clear, EOM's intact, fundi    benign, both eyes  Ears:    Normal TM's and external ear canals, both ears  Neck:   Supple, symmetrical, trachea midline, no adenopathy;    thyroid:  no enlargement/tenderness/nodules; no carotid   bruit or JVD  Back:     Symmetric, no curvature, ROM normal, no CVA tenderness  Lungs:     Clear to auscultation bilaterally, respirations unlabored  Chest Wall:    No tenderness or deformity   Heart:    Normal heart rate. Normal rhythm. No murmurs, rubs, or gallops.    Breast Exam:    deferred  Abdomen:     Soft,  non-tender, bowel sounds active all four quadrants,    no masses, no organomegaly  Pelvic:    deferred  Extremities:   All extremities are intact. No cyanosis or edema  Pulses:   2+ and symmetric all extremities  Skin:   Skin color, texture, turgor normal, no rashes or lesions  Lymph nodes:   Cervical, supraclavicular, and axillary nodes normal  Neurologic:   CNII-XII intact, normal strength, sensation and reflexes    throughout     Last depression screening scores PHQ 2/9 Scores 10/18/2020 02/18/2019  PHQ - 2 Score 0 0   Last fall risk screening Fall Risk  10/18/2020  Falls in the past year? 0   Last Audit-C alcohol use screening Alcohol Use Disorder Test (AUDIT) 10/18/2020  1. How often do you have a drink containing alcohol? 2  2. How many drinks containing alcohol do you have on a typical day when you are drinking? 0  3. How often do you have six or more drinks on one occasion? 0  AUDIT-C Score 2   A score of 3 or more in women, and 4 or more in men indicates increased risk for alcohol abuse, EXCEPT if all of the points are from question 1     Assessment & Plan    Routine Health Maintenance and Physical Exam  Exercise Activities and Dietary recommendations  Goals      Exercise 150 minutes per week (moderate activity)        Immunization History  Administered Date(s) Administered   Influenza,inj,Quad PF,6+ Mos 04/23/2015, 05/27/2016, 02/18/2018, 02/18/2019   PFIZER(Purple Top)SARS-COV-2 Vaccination 07/04/2019, 07/25/2019, 04/14/2020   Tdap 10/18/2020   Zoster Recombinat (Shingrix) 10/18/2020    Health Maintenance  Topic Date Due   HIV Screening  Never done   Hepatitis C Screening  Never done   MAMMOGRAM  05/24/2019   PAP SMEAR-Modifier  04/11/2020   COVID-19 Vaccine (4 - Booster for Pfizer series) 08/13/2020   INFLUENZA VACCINE  12/06/2020   Zoster Vaccines- Shingrix (2 of 2) 12/13/2020   COLONOSCOPY (Pts 45-40yrs Insurance coverage will need to be confirmed)   01/05/2021   TETANUS/TDAP  10/19/2030   Pneumococcal Vaccine 71-60 Years old  Aged Out   HPV VACCINES  Aged Out    Discussed health benefits of physical activity, and encouraged her to engage in regular exercise appropriate for her age and condition.  1. Annual physical exam In very good health. Encouraged to establish with gyn since Dr. Keturah Barre has left. Reports having had three doses of Covid vaccine and a Covid infection so is likely very well protected for the time being.   - Comprehensive metabolic panel - CBC - Lipid panel  2. Colon cancer screening  - Ambulatory referral to Gastroenterology  3. Need for hepatitis C screening test  - Hepatitis C antibody  4. Need for shingles vaccine  - Administer Zoster, Recombinant (Shingrix) Vaccine #1  5. Need for tetanus, diphtheria, and acellular pertussis (Tdap) vaccine in patient of adolescent age or older  - Administer Tetanus-diphtheria-acellular pertussis (Tdap) vaccine  6. Gastroesophageal reflux disease without esophagitis Very well controlled with diet and prn omeprazole.   7. Adjustment disorder with other symptom Doing very well with sertraline for many years.        The entirety of the information documented in the History of Present Illness, Review of Systems and Physical Exam were personally obtained by me. Portions of this information were initially documented by the CMA and reviewed by me for thoroughness and accuracy.     Lelon Huh, MD  [facility] [phone] (phone) [phone] (fax)  [place]

## 2020-10-19 LAB — CBC
Hematocrit: 38.3 % (ref 34.0–46.6)
Hemoglobin: 12.4 g/dL (ref 11.1–15.9)
MCH: 29.7 pg (ref 26.6–33.0)
MCHC: 32.4 g/dL (ref 31.5–35.7)
MCV: 92 fL (ref 79–97)
Platelets: 211 10*3/uL (ref 150–450)
RBC: 4.17 x10E6/uL (ref 3.77–5.28)
RDW: 13.1 % (ref 11.7–15.4)
WBC: 4.8 10*3/uL (ref 3.4–10.8)

## 2020-10-19 LAB — COMPREHENSIVE METABOLIC PANEL
ALT: 24 IU/L (ref 0–32)
AST: 20 IU/L (ref 0–40)
Albumin/Globulin Ratio: 2.3 — ABNORMAL HIGH (ref 1.2–2.2)
Albumin: 4.6 g/dL (ref 3.8–4.9)
Alkaline Phosphatase: 50 IU/L (ref 44–121)
BUN/Creatinine Ratio: 21 (ref 9–23)
BUN: 17 mg/dL (ref 6–24)
Bilirubin Total: 0.2 mg/dL (ref 0.0–1.2)
CO2: 22 mmol/L (ref 20–29)
Calcium: 9.4 mg/dL (ref 8.7–10.2)
Chloride: 100 mmol/L (ref 96–106)
Creatinine, Ser: 0.82 mg/dL (ref 0.57–1.00)
Globulin, Total: 2 g/dL (ref 1.5–4.5)
Glucose: 83 mg/dL (ref 65–99)
Potassium: 4.3 mmol/L (ref 3.5–5.2)
Sodium: 139 mmol/L (ref 134–144)
Total Protein: 6.6 g/dL (ref 6.0–8.5)
eGFR: 84 mL/min/{1.73_m2} (ref >59)

## 2020-10-19 LAB — LIPID PANEL
Chol/HDL Ratio: 2.7 ratio (ref 0.0–4.4)
Cholesterol, Total: 189 mg/dL (ref 100–199)
HDL: 71 mg/dL (ref >39)
LDL Chol Calc (NIH): 98 mg/dL (ref 0–99)
Triglycerides: 114 mg/dL (ref 0–149)
VLDL Cholesterol Cal: 20 mg/dL (ref 5–40)

## 2020-10-19 LAB — HEPATITIS C ANTIBODY: Hep C Virus Ab: <0.1 s/co ratio (ref 0.0–0.9)

## 2020-10-27 ENCOUNTER — Other Ambulatory Visit: Payer: Self-pay

## 2020-10-27 MED ORDER — ESTROGENS CONJUGATED 1.25 MG PO TABS
ORAL_TABLET | ORAL | 1 refills | Status: DC
  Filled 2020-10-27: qty 180, 90d supply, fill #0
  Filled 2020-10-27: qty 7, 7d supply, fill #0
  Filled 2020-10-27: qty 83, 83d supply, fill #0
  Filled 2020-11-03: qty 90, 90d supply, fill #1

## 2020-10-28 ENCOUNTER — Other Ambulatory Visit: Payer: Self-pay

## 2020-11-03 ENCOUNTER — Other Ambulatory Visit: Payer: Self-pay

## 2020-11-10 ENCOUNTER — Other Ambulatory Visit: Payer: Self-pay | Admitting: Family Medicine

## 2020-11-10 DIAGNOSIS — Z1231 Encounter for screening mammogram for malignant neoplasm of breast: Secondary | ICD-10-CM

## 2020-11-17 ENCOUNTER — Telehealth (INDEPENDENT_AMBULATORY_CARE_PROVIDER_SITE_OTHER): Payer: Self-pay | Admitting: Gastroenterology

## 2020-11-17 ENCOUNTER — Other Ambulatory Visit: Payer: Self-pay

## 2020-11-17 DIAGNOSIS — Z1211 Encounter for screening for malignant neoplasm of colon: Secondary | ICD-10-CM

## 2020-11-17 MED ORDER — NA SULFATE-K SULFATE-MG SULF 17.5-3.13-1.6 GM/177ML PO SOLN
1.0000 | Freq: Once | ORAL | 0 refills | Status: AC
  Filled 2020-11-17: qty 354, 1d supply, fill #0

## 2020-11-17 NOTE — Progress Notes (Signed)
Gastroenterology Pre-Procedure Review  Request Date: 12/28/20 Requesting Physician: Dr. Allen Norris  PATIENT REVIEW QUESTIONS: The patient responded to the following health history questions as indicated:    1. Are you having any GI issues? no 2. Do you have a personal history of Polyps?  01/06/2011 no polyps removed. 3. Do you have a family history of Colon Cancer or Polyps? no 4. Diabetes Mellitus? no 5. Joint replacements in the past 12 months?no 6. Major health problems in the past 3 months?no 7. Any artificial heart valves, MVP, or defibrillator?no    MEDICATIONS & ALLERGIES:    Patient reports the following regarding taking any anticoagulation/antiplatelet therapy:   Plavix, Coumadin, Eliquis, Xarelto, Lovenox, Pradaxa, Brilinta, or Effient? no Aspirin? no  Patient confirms/reports the following medications:  Current Outpatient Medications  Medication Sig Dispense Refill   clidinium-chlordiazePOXIDE (LIBRAX) 5-2.5 MG capsule TAKE 1 CAPSULE BY MOUTH 2 TIMES DAILY AS NEEDED. 60 capsule 1   estrogens, conjugated, (PREMARIN) 1.25 MG tablet Take 2 tablets (2.5 mg total) by mouth daily. 180 tablet 3   estrogens, conjugated, (PREMARIN) 1.25 MG tablet Take 2 tablets by mouth daily 180 tablet 1   omeprazole (PRILOSEC) 20 MG capsule Take 1 capsule (20 mg total) by mouth daily. 90 capsule 3   Phosphatidylserine-DHA-EPA (VAYACOG) 100-19.5-6.5 MG CAPS Take 1 capsule by mouth daily.     sertraline (ZOLOFT) 100 MG tablet TAKE 1 TABLET BY MOUTH DAILY. MUST BE MANUFACTURED BY [employer]. PATIENT DOES NOT TOLERATE MEDICATION BY OTHER MANUFACTURERS. 90 tablet 1   No current facility-administered medications for this visit.    Patient confirms/reports the following allergies:  No Known Allergies  No orders of the defined types were placed in this encounter.   AUTHORIZATION INFORMATION Primary Insurance: 1D#: Group #:  Secondary Insurance: 1D#: Group #:  SCHEDULE INFORMATION: Date:  12/28/20 Time: Location: [place]

## 2020-11-19 ENCOUNTER — Other Ambulatory Visit: Payer: Self-pay

## 2020-12-27 ENCOUNTER — Encounter: Payer: Self-pay | Admitting: Gastroenterology

## 2020-12-28 ENCOUNTER — Ambulatory Visit: Payer: 59 | Admitting: Anesthesiology

## 2020-12-28 ENCOUNTER — Encounter
Admission: RE | Disposition: A | Discharge status: Home / Self Care | Payer: Self-pay | Source: Ambulatory Visit | Attending: Gastroenterology

## 2020-12-28 ENCOUNTER — Encounter: Payer: Self-pay | Admitting: Gastroenterology

## 2020-12-28 ENCOUNTER — Ambulatory Visit
Admission: RE | Admit: 2020-12-28 | Discharge: 2020-12-28 | Disposition: A | Discharge status: Home / Self Care | Payer: 59 | Source: Ambulatory Visit | Attending: Gastroenterology | Admitting: Gastroenterology

## 2020-12-28 DIAGNOSIS — Z79899 Other long term (current) drug therapy: Secondary | ICD-10-CM | POA: Insufficient documentation

## 2020-12-28 DIAGNOSIS — N393 Stress incontinence (female) (male): Secondary | ICD-10-CM | POA: Diagnosis not present

## 2020-12-28 DIAGNOSIS — R1013 Epigastric pain: Secondary | ICD-10-CM | POA: Diagnosis not present

## 2020-12-28 DIAGNOSIS — Z79890 Hormone replacement therapy (postmenopausal): Secondary | ICD-10-CM | POA: Insufficient documentation

## 2020-12-28 DIAGNOSIS — Z1211 Encounter for screening for malignant neoplasm of colon: Secondary | ICD-10-CM | POA: Diagnosis not present

## 2020-12-28 HISTORY — PX: ESOPHAGOGASTRODUODENOSCOPY: SHX5428

## 2020-12-28 HISTORY — PX: COLONOSCOPY WITH PROPOFOL: SHX5780

## 2020-12-28 SURGERY — COLONOSCOPY WITH PROPOFOL
Anesthesia: General

## 2020-12-28 MED ORDER — PROPOFOL 10 MG/ML IV BOLUS
INTRAVENOUS | Status: AC
  Filled 2020-12-28: qty 20

## 2020-12-28 MED ORDER — DEXMEDETOMIDINE (PRECEDEX) IN NS 20 MCG/5ML (4 MCG/ML) IV SYRINGE
PREFILLED_SYRINGE | INTRAVENOUS | Status: DC | PRN
  Administered 2020-12-28: 8 ug via INTRAVENOUS

## 2020-12-28 MED ORDER — PROPOFOL 500 MG/50ML IV EMUL
INTRAVENOUS | Status: DC | PRN
  Administered 2020-12-28: 100 ug/kg/min via INTRAVENOUS

## 2020-12-28 MED ORDER — SODIUM CHLORIDE 0.9 % IV SOLN
INTRAVENOUS | Status: DC
  Administered 2020-12-28: 1000 mL via INTRAVENOUS

## 2020-12-28 MED ORDER — PHENYLEPHRINE HCL (PRESSORS) 10 MG/ML IV SOLN
INTRAVENOUS | Status: DC | PRN
  Administered 2020-12-28: 100 ug via INTRAVENOUS

## 2020-12-28 MED ORDER — LIDOCAINE HCL (CARDIAC) PF 100 MG/5ML IV SOSY
PREFILLED_SYRINGE | INTRAVENOUS | Status: DC | PRN
  Administered 2020-12-28: 50 mg via INTRAVENOUS

## 2020-12-28 MED ORDER — PROPOFOL 10 MG/ML IV BOLUS
INTRAVENOUS | Status: DC | PRN
  Administered 2020-12-28: 20 mg via INTRAVENOUS
  Administered 2020-12-28: 70 mg via INTRAVENOUS
  Administered 2020-12-28: 40 mg via INTRAVENOUS
  Administered 2020-12-28: 20 mg via INTRAVENOUS

## 2020-12-28 NOTE — Transfer of Care (Signed)
Immediate Anesthesia Transfer of Care Note  Patient: Alyssa Humphrey  Procedure(s) Performed: COLONOSCOPY WITH PROPOFOL ESOPHAGOGASTRODUODENOSCOPY (EGD)  Patient Location: PACU  Anesthesia Type:MAC  Level of Consciousness: drowsy  Airway & Oxygen Therapy: Patient Spontanous Breathing and Patient connected to face mask oxygen  Post-op Assessment: Report given to RN  Post vital signs: Reviewed and stable  Last Vitals:  Vitals Value Taken Time  BP 78/52 12/28/20 1044  Temp 35.8 C 12/28/20 1044  Pulse 72 12/28/20 1044  Resp 15 12/28/20 1044  SpO2 98 % 12/28/20 1044    Last Pain:  Vitals:   12/28/20 1044  TempSrc: Temporal  PainSc: Asleep         Complications: No notable events documented.

## 2020-12-28 NOTE — Op Note (Signed)
[facility] Gastroenterology Patient Name: Alyssa Humphrey Procedure Date: 12/28/2020 10:19 AM MRN: [id number] Account #: [id number] Date of Birth: 02-09-1966 Admit Type: Outpatient Age: 55 Room: [facility] ENDO ROOM 4 Gender: Female Note Status: Finalized Procedure:             Colonoscopy Indications:           Screening for colorectal malignant neoplasm Providers:             Lucilla Lame MD, MD Referring MD:          Kirstie Peri. Caryn Section, MD (Referring MD) Medicines:             Propofol per Anesthesia Complications:         No immediate complications. Procedure:             Pre-Anesthesia Assessment:                        - Prior to the procedure, a History and Physical was                         performed, and patient medications and allergies were                         reviewed. The patient's tolerance of previous                         anesthesia was also reviewed. The risks and benefits                         of the procedure and the sedation options and risks                         were discussed with the patient. All questions were                         answered, and informed consent was obtained. Prior                         Anticoagulants: The patient has taken no previous                         anticoagulant or antiplatelet agents. ASA Grade                         Assessment: II - A patient with mild systemic disease.                         After reviewing the risks and benefits, the patient                         was deemed in satisfactory condition to undergo the                         procedure.                        After obtaining informed consent, the colonoscope was  passed under direct vision. Throughout the procedure,                         the patient's blood pressure, pulse, and oxygen                         saturations were monitored continuously. The                         Colonoscope was introduced through  the anus and                         advanced to the the cecum, identified by appendiceal                         orifice and ileocecal valve. The colonoscopy was                         performed without difficulty. The patient tolerated                         the procedure well. The quality of the bowel                         preparation was excellent. Findings:      The perianal and digital rectal examinations were normal.      The colon (entire examined portion) appeared normal. Impression:            - The entire examined colon is normal.                        - No specimens collected. Recommendation:        - Discharge patient to home.                        - Resume previous diet.                        - Continue present medications.                        - Repeat colonoscopy in 10 years for screening                         purposes.                        - unless any change in family history or lower GI                         problems. Procedure Code(s):     --- Professional ---                        [id number], Colonoscopy, flexible; diagnostic, including                         collection of specimen(s) by brushing or washing, when                         performed (separate procedure)  Diagnosis Code(s):     --- Professional ---                        Z12.11, Encounter for screening for malignant neoplasm                         of colon CPT copyright 2019 [employer]. All rights reserved. The codes documented in this report are preliminary and upon coder review may  be revised to meet current compliance requirements. Lucilla Lame MD, MD 12/28/2020 10:42:01 AM This report has been signed electronically. Number of Addenda: 0 Note Initiated On: 12/28/2020 10:19 AM Scope Withdrawal Time: 0 hours 6 minutes 16 seconds  Total Procedure Duration: 0 hours 9 minutes 33 seconds  Estimated Blood Loss:  Estimated blood loss: none.      [facility]

## 2020-12-28 NOTE — Op Note (Signed)
[facility] Patient Name: Alyssa Humphrey Procedure Date: 12/28/2020 10:20 AM MRN: [id number] Account #: [id number] Date of Birth: Nov 06, 1965 Admit Type: Outpatient Age: 55 Room: [facility] ENDO ROOM 4 Gender: Female Note Status: Finalized Procedure:             Upper GI endoscopy Indications:           Dyspepsia Providers:             Lucilla Lame MD, MD Referring MD:          Kirstie Peri. Caryn Section, MD (Referring MD) Medicines:             Propofol per Anesthesia Complications:         No immediate complications. Procedure:             Pre-Anesthesia Assessment:                        - Prior to the procedure, a History and Physical was                         performed, and patient medications and allergies were                         reviewed. The patient's tolerance of previous                         anesthesia was also reviewed. The risks and benefits                         of the procedure and the sedation options and risks                         were discussed with the patient. All questions were                         answered, and informed consent was obtained. Prior                         Anticoagulants: The patient has taken no previous                         anticoagulant or antiplatelet agents. ASA Grade                         Assessment: II - A patient with mild systemic disease.                         After reviewing the risks and benefits, the patient                         was deemed in satisfactory condition to undergo the                         procedure.                        After obtaining informed consent, the endoscope was  passed under direct vision. Throughout the procedure,                         the patient's blood pressure, pulse, and oxygen                         saturations were monitored continuously. The Endoscope                         was introduced through the mouth, and advanced to the                          second part of duodenum. The upper GI endoscopy was                         accomplished without difficulty. The patient tolerated                         the procedure well. Findings:      The examined esophagus was normal.      The entire examined stomach was normal. Biopsies were taken with a cold       forceps for histology.      The examined duodenum was normal. Impression:            - Normal esophagus.                        - Normal stomach. Biopsied.                        - Normal examined duodenum. Recommendation:        - Discharge patient to home.                        - Resume previous diet.                        - Continue present medications.                        - Await pathology results.                        - Perform a colonoscopy today. Procedure Code(s):     --- Professional ---                        [phone], Esophagogastroduodenoscopy, flexible,                         transoral; with biopsy, single or multiple Diagnosis Code(s):     --- Professional ---                        R10.13, Epigastric pain CPT copyright 2019 American Medical Association. All rights reserved. The codes documented in this report are preliminary and upon coder review may  be revised to meet current compliance requirements. Lucilla Lame MD, MD 12/28/2020 10:29:20 AM This report has been signed electronically. Number of Addenda: 0 Note Initiated On: 12/28/2020 10:20 AM Estimated Blood Loss:  Estimated blood loss: none.      [facility]

## 2020-12-28 NOTE — H&P (Signed)
Alyssa Lame, MD [facility] [address], [place], [place] [address] Phone:[phone] Fax : [phone]  Primary Care Physician:  Alyssa Sons, MD Primary Gastroenterologist:  Dr. Allen Norris  Pre-Procedure History & Physical: HPI:  Alyssa Humphrey is a 55 y.o. female is here for an endoscopy and colonoscopy.   Past Medical History:  Diagnosis Date   Acid reflux 03/15/2015   Adaptation reaction 03/15/2015   Dermatitis, eczematoid 03/15/2015   hand    Family history of Alzheimer's disease 03/15/2015   Impingement syndrome of shoulder 08/11/2002   Injected per Ortho    Patella-femoral syndrome 04/25/2010   treated by Dr Cipriano Mile    Post menopausal syndrome 03/15/2015   Skin lesion 09/28/2003   Lesion on right chin removed by Dr Debe Coder Clarcke-Compound nevus    Stress incontinence 01/05/2009   Treated by Dr. Arlyn Leak     Past Surgical History:  Procedure Laterality Date   ABDOMINAL HYSTERECTOMY  2007   CESAREAN SECTION  04/26/92; 01/15/96; 12/18/99   DILATION AND CURETTAGE OF UTERUS     x's 2   KNEE SURGERY Left    NASAL SINUS SURGERY      Prior to Admission medications   Medication Sig Start Date End Date Taking? Authorizing Provider  clidinium-chlordiazePOXIDE (LIBRAX) 5-2.5 MG capsule TAKE 1 CAPSULE BY MOUTH 2 TIMES DAILY AS NEEDED. 09/08/20 09/08/21 Yes Alyssa Sons, MD  estrogens, conjugated, (PREMARIN) 1.25 MG tablet Take 2 tablets (2.5 mg total) by mouth daily. 11/18/19  Yes Alyssa Sons, MD  omeprazole (PRILOSEC) 20 MG capsule Take 1 capsule (20 mg total) by mouth daily. 02/18/19  Yes Alyssa Sons, MD  Phosphatidylserine-DHA-EPA 100-19.5-6.5 MG CAPS Take 1 capsule by mouth daily. 07/02/13  Yes [provider]  sertraline (ZOLOFT) 100 MG tablet TAKE 1 TABLET BY MOUTH DAILY. MUST BE MANUFACTURED BY [employer]. PATIENT DOES NOT TOLERATE MEDICATION BY OTHER MANUFACTURERS. 08/17/20  Yes Alyssa Sons, MD  estrogens, conjugated, (PREMARIN) 1.25 MG tablet Take 2  tablets by mouth daily 11/18/19   Alyssa Sons, MD    Allergies as of 11/17/2020   (No Known Allergies)    Family History  Problem Relation Age of Onset   Dementia Mother    Osteoarthritis Mother    Hypertension Mother    Hypertension Father    Osteoarthritis Father    Healthy Sister    Healthy Sister    Kidney cancer Neg Hx    Breast cancer Neg Hx     Social History   Socioeconomic History   Marital status: Married    Spouse name: Not on file   Number of children: Not on file   Years of education: Not on file   Highest education level: Not on file  Occupational History   Not on file  Tobacco Use   Smoking status: Never   Smokeless tobacco: Never  Vaping Use   Vaping Use: Never used  Substance and Sexual Activity   Alcohol use: Yes    Alcohol/week: 2.0 standard drinks    Types: 2 Glasses of wine per week   Drug use: No   Sexual activity: Yes    Birth control/protection: Surgical  Other Topics Concern   Not on file  Social History Narrative   Not on file   Social Determinants of Health   Financial Resource Strain: Not on file  Food Insecurity: Not on file  Transportation Needs: Not on file  Physical Activity: Not on file  Stress:  Not on file  Social Connections: Not on file  Intimate Partner Violence: Not on file    Review of Systems: See HPI, otherwise negative ROS  Physical Exam: BP 106/65   Pulse 74   Temp (!) 97 F (36.1 C) (Temporal)   Resp 16   Ht '5\' 5"'$  (1.651 m)   Wt 51.8 kg   SpO2 99%   BMI 19.01 kg/m  General:   Alert,  pleasant and cooperative in NAD Head:  Normocephalic and atraumatic. Neck:  Supple; no masses or thyromegaly. Lungs:  Clear throughout to auscultation.    Heart:  Regular rate and rhythm. Abdomen:  Soft, nontender and nondistended. Normal bowel sounds, without guarding, and without rebound.   Neurologic:  Alert and  oriented x4;  grossly normal neurologically.  Impression/Plan: Alyssa Humphrey is here for an  endoscopy and colonoscopy to be performed for dyspepsia and screening  Risks, benefits, limitations, and alternatives regarding  endoscopy and colonoscopy have been reviewed with the patient.  Questions have been answered.  All parties agreeable.   Alyssa Lame, MD  12/28/2020, 10:08 AM

## 2020-12-28 NOTE — Anesthesia Preprocedure Evaluation (Signed)
Anesthesia Evaluation  Patient identified by MRN, date of birth, ID band Patient awake    Reviewed: Allergy & Precautions, NPO status , Patient's Chart, lab work & pertinent test results  Airway Mallampati: II  TM Distance: >3 FB Neck ROM: Full    Dental no notable dental hx.    Pulmonary neg pulmonary ROS,    Pulmonary exam normal        Cardiovascular negative cardio ROS Normal cardiovascular exam     Neuro/Psych PSYCHIATRIC DISORDERS negative neurological ROS     GI/Hepatic Neg liver ROS, GERD  Medicated,  Endo/Other  negative endocrine ROS  Renal/GU negative Renal ROS  negative genitourinary   Musculoskeletal negative musculoskeletal ROS (+)   Abdominal   Peds negative pediatric ROS (+)  Hematology negative hematology ROS (+)   Anesthesia Other Findings Acid reflux 03/15/2015   Adaptation reaction 03/15/2015   Dermatitis, eczematoid 03/15/2015 hand   Family history of Alzheimer's disease 03/15/2015   Impingement syndrome of shoulder 08/11/2002 Injected per Ortho   Patella-femoral syndrome 04/25/2010 treated by Dr Cipriano Mile   Post menopausal syndrome 03/15/2015   Skin lesion 09/28/2003 Lesion on right chin removed by Dr Debe Coder Clarcke-Compound nevus   Stress incontinence       Reproductive/Obstetrics negative OB ROS                             Anesthesia Physical Anesthesia Plan  ASA: 2  Anesthesia Plan: General   Post-op Pain Management:    Induction: Intravenous  PONV Risk Score and Plan: 2 and Propofol infusion and TIVA  Airway Management Planned: Natural Airway and Nasal Cannula  Additional Equipment:   Intra-op Plan:   Post-operative Plan:   Informed Consent: I have reviewed the patients History and Physical, chart, labs and discussed the procedure including the risks, benefits and alternatives for the proposed anesthesia with the patient or authorized representative  who has indicated his/her understanding and acceptance.       Plan Discussed with: CRNA and Surgeon  Anesthesia Plan Comments:         Anesthesia Quick Evaluation

## 2020-12-28 NOTE — Anesthesia Postprocedure Evaluation (Signed)
Anesthesia Post Note  Patient: Alyssa Humphrey  Procedure(s) Performed: COLONOSCOPY WITH PROPOFOL ESOPHAGOGASTRODUODENOSCOPY (EGD)  Patient location during evaluation: Phase II Anesthesia Type: General Level of consciousness: awake and alert, awake and oriented Pain management: pain level controlled Vital Signs Assessment: post-procedure vital signs reviewed and stable Respiratory status: spontaneous breathing, nonlabored ventilation and respiratory function stable Cardiovascular status: blood pressure returned to baseline and stable Postop Assessment: no apparent nausea or vomiting Anesthetic complications: no   No notable events documented.   Last Vitals:  Vitals:   12/28/20 1054 12/28/20 1104  BP: (!) 88/57 (!) 87/52  Pulse: 62 64  Resp: 17 14  Temp:    SpO2: 96% 97%    Last Pain:  Vitals:   12/28/20 1104  TempSrc:   PainSc: 0-No pain                 Phill Mutter

## 2020-12-29 ENCOUNTER — Encounter: Payer: Self-pay | Admitting: Gastroenterology

## 2020-12-29 LAB — SURGICAL PATHOLOGY

## 2020-12-30 ENCOUNTER — Encounter: Payer: Self-pay | Admitting: Gastroenterology

## 2021-01-26 ENCOUNTER — Other Ambulatory Visit: Payer: Self-pay

## 2021-01-26 ENCOUNTER — Other Ambulatory Visit: Payer: Self-pay | Admitting: Family Medicine

## 2021-01-26 MED FILL — Estrogens, Conjugated Tab 1.25 MG: ORAL | 90 days supply | Qty: 180 | Fill #0 | Status: AC

## 2021-01-27 ENCOUNTER — Other Ambulatory Visit: Payer: Self-pay

## 2021-01-27 DIAGNOSIS — N3946 Mixed incontinence: Secondary | ICD-10-CM | POA: Diagnosis not present

## 2021-01-27 DIAGNOSIS — R350 Frequency of micturition: Secondary | ICD-10-CM | POA: Diagnosis not present

## 2021-01-28 ENCOUNTER — Ambulatory Visit
Admission: RE | Admit: 2021-01-28 | Discharge: 2021-01-28 | Disposition: A | Discharge status: Home / Self Care | Payer: 59 | Source: Ambulatory Visit | Attending: Family Medicine | Admitting: Family Medicine

## 2021-01-28 ENCOUNTER — Other Ambulatory Visit: Payer: Self-pay

## 2021-01-28 DIAGNOSIS — Z1231 Encounter for screening mammogram for malignant neoplasm of breast: Secondary | ICD-10-CM | POA: Diagnosis not present

## 2021-02-07 DIAGNOSIS — N390 Urinary tract infection, site not specified: Secondary | ICD-10-CM | POA: Diagnosis not present

## 2021-02-10 ENCOUNTER — Other Ambulatory Visit: Payer: Self-pay

## 2021-02-23 ENCOUNTER — Other Ambulatory Visit: Payer: Self-pay

## 2021-02-23 DIAGNOSIS — M7541 Impingement syndrome of right shoulder: Secondary | ICD-10-CM | POA: Diagnosis not present

## 2021-02-23 MED ORDER — MELOXICAM 7.5 MG PO TABS
ORAL_TABLET | ORAL | 1 refills | Status: DC
  Filled 2021-02-23: qty 60, 30d supply, fill #0
  Filled 2021-07-25: qty 60, 30d supply, fill #1

## 2021-03-02 DIAGNOSIS — R350 Frequency of micturition: Secondary | ICD-10-CM | POA: Diagnosis not present

## 2021-03-02 DIAGNOSIS — N3946 Mixed incontinence: Secondary | ICD-10-CM | POA: Diagnosis not present

## 2021-03-03 DIAGNOSIS — M25511 Pain in right shoulder: Secondary | ICD-10-CM | POA: Diagnosis not present

## 2021-03-03 DIAGNOSIS — M7541 Impingement syndrome of right shoulder: Secondary | ICD-10-CM | POA: Diagnosis not present

## 2021-03-09 DIAGNOSIS — M7541 Impingement syndrome of right shoulder: Secondary | ICD-10-CM | POA: Diagnosis not present

## 2021-03-09 DIAGNOSIS — M25511 Pain in right shoulder: Secondary | ICD-10-CM | POA: Diagnosis not present

## 2021-03-11 DIAGNOSIS — M7541 Impingement syndrome of right shoulder: Secondary | ICD-10-CM | POA: Diagnosis not present

## 2021-03-11 DIAGNOSIS — M25511 Pain in right shoulder: Secondary | ICD-10-CM | POA: Diagnosis not present

## 2021-03-14 DIAGNOSIS — M7541 Impingement syndrome of right shoulder: Secondary | ICD-10-CM | POA: Diagnosis not present

## 2021-03-14 DIAGNOSIS — M25511 Pain in right shoulder: Secondary | ICD-10-CM | POA: Diagnosis not present

## 2021-03-15 DIAGNOSIS — M25511 Pain in right shoulder: Secondary | ICD-10-CM | POA: Diagnosis not present

## 2021-03-15 DIAGNOSIS — M7541 Impingement syndrome of right shoulder: Secondary | ICD-10-CM | POA: Diagnosis not present

## 2021-03-16 DIAGNOSIS — N3946 Mixed incontinence: Secondary | ICD-10-CM | POA: Diagnosis not present

## 2021-03-21 DIAGNOSIS — M7541 Impingement syndrome of right shoulder: Secondary | ICD-10-CM | POA: Diagnosis not present

## 2021-03-21 DIAGNOSIS — M25511 Pain in right shoulder: Secondary | ICD-10-CM | POA: Diagnosis not present

## 2021-03-24 DIAGNOSIS — M7541 Impingement syndrome of right shoulder: Secondary | ICD-10-CM | POA: Diagnosis not present

## 2021-03-24 DIAGNOSIS — M25511 Pain in right shoulder: Secondary | ICD-10-CM | POA: Diagnosis not present

## 2021-03-28 DIAGNOSIS — M7541 Impingement syndrome of right shoulder: Secondary | ICD-10-CM | POA: Diagnosis not present

## 2021-03-28 DIAGNOSIS — M25511 Pain in right shoulder: Secondary | ICD-10-CM | POA: Diagnosis not present

## 2021-04-05 DIAGNOSIS — M7541 Impingement syndrome of right shoulder: Secondary | ICD-10-CM | POA: Diagnosis not present

## 2021-04-05 DIAGNOSIS — M25511 Pain in right shoulder: Secondary | ICD-10-CM | POA: Diagnosis not present

## 2021-04-14 DIAGNOSIS — M7541 Impingement syndrome of right shoulder: Secondary | ICD-10-CM | POA: Diagnosis not present

## 2021-04-14 DIAGNOSIS — M25511 Pain in right shoulder: Secondary | ICD-10-CM | POA: Diagnosis not present

## 2021-04-22 DIAGNOSIS — M7541 Impingement syndrome of right shoulder: Secondary | ICD-10-CM | POA: Diagnosis not present

## 2021-04-22 DIAGNOSIS — M25511 Pain in right shoulder: Secondary | ICD-10-CM | POA: Diagnosis not present

## 2021-04-27 DIAGNOSIS — M7541 Impingement syndrome of right shoulder: Secondary | ICD-10-CM | POA: Diagnosis not present

## 2021-04-28 DIAGNOSIS — M7541 Impingement syndrome of right shoulder: Secondary | ICD-10-CM | POA: Diagnosis not present

## 2021-04-28 DIAGNOSIS — M25511 Pain in right shoulder: Secondary | ICD-10-CM | POA: Diagnosis not present

## 2021-05-04 DIAGNOSIS — N3946 Mixed incontinence: Secondary | ICD-10-CM | POA: Diagnosis not present

## 2021-05-05 DIAGNOSIS — M25511 Pain in right shoulder: Secondary | ICD-10-CM | POA: Diagnosis not present

## 2021-05-05 DIAGNOSIS — M7541 Impingement syndrome of right shoulder: Secondary | ICD-10-CM | POA: Diagnosis not present

## 2021-05-12 DIAGNOSIS — M25511 Pain in right shoulder: Secondary | ICD-10-CM | POA: Diagnosis not present

## 2021-05-12 DIAGNOSIS — M7541 Impingement syndrome of right shoulder: Secondary | ICD-10-CM | POA: Diagnosis not present

## 2021-05-16 DIAGNOSIS — M7541 Impingement syndrome of right shoulder: Secondary | ICD-10-CM | POA: Diagnosis not present

## 2021-05-16 DIAGNOSIS — M25511 Pain in right shoulder: Secondary | ICD-10-CM | POA: Diagnosis not present

## 2021-05-24 DIAGNOSIS — M7541 Impingement syndrome of right shoulder: Secondary | ICD-10-CM | POA: Diagnosis not present

## 2021-05-24 DIAGNOSIS — M25511 Pain in right shoulder: Secondary | ICD-10-CM | POA: Diagnosis not present

## 2021-05-26 DIAGNOSIS — M25511 Pain in right shoulder: Secondary | ICD-10-CM | POA: Diagnosis not present

## 2021-05-26 DIAGNOSIS — M7541 Impingement syndrome of right shoulder: Secondary | ICD-10-CM | POA: Diagnosis not present

## 2021-05-30 DIAGNOSIS — M7541 Impingement syndrome of right shoulder: Secondary | ICD-10-CM | POA: Diagnosis not present

## 2021-05-30 DIAGNOSIS — M25511 Pain in right shoulder: Secondary | ICD-10-CM | POA: Diagnosis not present

## 2021-06-02 DIAGNOSIS — M7541 Impingement syndrome of right shoulder: Secondary | ICD-10-CM | POA: Diagnosis not present

## 2021-06-02 DIAGNOSIS — M25511 Pain in right shoulder: Secondary | ICD-10-CM | POA: Diagnosis not present

## 2021-06-09 ENCOUNTER — Other Ambulatory Visit: Payer: Self-pay

## 2021-06-09 DIAGNOSIS — M25511 Pain in right shoulder: Secondary | ICD-10-CM | POA: Diagnosis not present

## 2021-06-09 DIAGNOSIS — M7541 Impingement syndrome of right shoulder: Secondary | ICD-10-CM | POA: Diagnosis not present

## 2021-06-09 DIAGNOSIS — H16042 Marginal corneal ulcer, left eye: Secondary | ICD-10-CM | POA: Diagnosis not present

## 2021-06-09 MED ORDER — GATIFLOXACIN 0.5 % OP SOLN
OPHTHALMIC | 1 refills | Status: DC
  Filled 2021-06-09: qty 2.5, 7d supply, fill #0

## 2021-06-14 DIAGNOSIS — H16042 Marginal corneal ulcer, left eye: Secondary | ICD-10-CM | POA: Diagnosis not present

## 2021-06-14 DIAGNOSIS — M7541 Impingement syndrome of right shoulder: Secondary | ICD-10-CM | POA: Diagnosis not present

## 2021-06-14 DIAGNOSIS — M25511 Pain in right shoulder: Secondary | ICD-10-CM | POA: Diagnosis not present

## 2021-06-16 DIAGNOSIS — M25511 Pain in right shoulder: Secondary | ICD-10-CM | POA: Diagnosis not present

## 2021-06-16 DIAGNOSIS — M7541 Impingement syndrome of right shoulder: Secondary | ICD-10-CM | POA: Diagnosis not present

## 2021-07-25 ENCOUNTER — Other Ambulatory Visit: Payer: Self-pay

## 2021-07-26 ENCOUNTER — Other Ambulatory Visit: Payer: Self-pay

## 2021-08-17 ENCOUNTER — Other Ambulatory Visit: Payer: Self-pay

## 2021-08-17 DIAGNOSIS — M7501 Adhesive capsulitis of right shoulder: Secondary | ICD-10-CM | POA: Diagnosis not present

## 2021-08-17 MED ORDER — METHYLPREDNISOLONE 4 MG PO TBPK
ORAL_TABLET | ORAL | 0 refills | Status: DC
  Filled 2021-08-17: qty 21, 6d supply, fill #0

## 2021-08-18 ENCOUNTER — Other Ambulatory Visit: Payer: Self-pay

## 2021-08-18 MED ORDER — SERTRALINE HCL 100 MG PO TABS
ORAL_TABLET | ORAL | 0 refills | Status: DC
  Filled 2021-08-18: qty 90, 90d supply, fill #0

## 2021-09-14 ENCOUNTER — Other Ambulatory Visit: Payer: Self-pay

## 2021-09-14 DIAGNOSIS — M25511 Pain in right shoulder: Secondary | ICD-10-CM | POA: Diagnosis not present

## 2021-09-14 MED ORDER — COVID-19 AT HOME ANTIGEN TEST VI KIT
PACK | 0 refills | Status: DC
  Filled 2021-09-14: qty 2, 4d supply, fill #0

## 2021-10-17 ENCOUNTER — Encounter: Payer: Self-pay | Admitting: Family Medicine

## 2021-10-17 ENCOUNTER — Ambulatory Visit (INDEPENDENT_AMBULATORY_CARE_PROVIDER_SITE_OTHER): Payer: 59 | Admitting: Family Medicine

## 2021-10-17 ENCOUNTER — Other Ambulatory Visit: Payer: Self-pay

## 2021-10-17 VITALS — BP 112/61 | HR 61 | Temp 97.5°F | Resp 12 | Ht 65.0 in

## 2021-10-17 DIAGNOSIS — K219 Gastro-esophageal reflux disease without esophagitis: Secondary | ICD-10-CM | POA: Diagnosis not present

## 2021-10-17 DIAGNOSIS — K589 Irritable bowel syndrome without diarrhea: Secondary | ICD-10-CM | POA: Diagnosis not present

## 2021-10-17 DIAGNOSIS — F419 Anxiety disorder, unspecified: Secondary | ICD-10-CM

## 2021-10-17 DIAGNOSIS — N951 Menopausal and female climacteric states: Secondary | ICD-10-CM

## 2021-10-17 MED ORDER — ESTROGENS CONJUGATED 1.25 MG PO TABS
1.2500 mg | ORAL_TABLET | Freq: Every day | ORAL | 4 refills | Status: DC
  Filled 2021-10-17: qty 90, 90d supply, fill #0
  Filled 2022-07-25: qty 30, 30d supply, fill #1

## 2021-10-17 MED ORDER — CILIDINIUM-CHLORDIAZEPOXIDE 2.5-5 MG PO CAPS
ORAL_CAPSULE | ORAL | 1 refills | Status: DC
  Filled 2021-10-17: qty 60, 30d supply, fill #0
  Filled 2022-05-18: qty 60, 30d supply, fill #1

## 2021-10-17 MED ORDER — SERTRALINE HCL 100 MG PO TABS
ORAL_TABLET | ORAL | 4 refills | Status: DC
Start: 2021-10-17 — End: 2022-10-13
  Filled 2021-10-17: qty 90, fill #0
  Filled 2022-04-03: qty 90, 90d supply, fill #0

## 2021-10-17 NOTE — Progress Notes (Signed)
I,Roshena L Chambers,acting as a scribe for Lelon Huh, MD.,have documented all relevant documentation on the behalf of Lelon Huh, MD,as directed by  Lelon Huh, MD while in the presence of Lelon Huh, MD.   Established patient visit   Patient: Alyssa Humphrey   DOB: Feb 24, 1966   56 y.o. Female  MRN: [id number] Visit Date: 10/17/2021  Today's healthcare provider: Lelon Huh, MD   Chief Complaint  Patient presents with   Depression   Subjective    HPI  Anxiety,  Follow-up:  She  was last seen for this 1  year  ago. Changes made at last visit include none; continue same treatment of sertraline.   She reports good compliance with treatment. She is not having side effects.   She reports good tolerance of treatment. She feels she is Unchanged since last visit.     10/18/2020    6:14 PM 02/18/2019    2:50 PM  Depression screen PHQ 2/9  Decreased Interest 0 0  Down, Depressed, Hopeless 0 0  PHQ - 2 Score 0 0    -----------------------------------------------------------------------------------------   She is also here for follow up IBS and GERD. Takes Librax rarely, but it remains very effective she takes it and is tolerating well with no adverse effects. She take omeprazole intermittently as soon as she feels reflux episode starting up.   Medications: Outpatient Medications Prior to Visit  Medication Sig   clidinium-chlordiazePOXIDE (LIBRAX) 5-2.5 MG capsule TAKE 1 CAPSULE BY MOUTH 2 TIMES DAILY AS NEEDED.   estrogens, conjugated, (PREMARIN) 1.25 MG tablet Take 2 tablets (2.5 mg total) by mouth daily.   estrogens, conjugated, (PREMARIN) 1.25 MG tablet Take 2 tablets by mouth daily   gatifloxacin (ZYMAXID) 0.5 % SOLN 1 GTT AFFECTED EYE EVERY 1-2 HOURS TODAY, THEN QID X 5 DAYS   meloxicam (MOBIC) 7.5 MG tablet Take 1 tablet twice a day by oral route.   omeprazole (PRILOSEC) 20 MG capsule Take 1 capsule (20 mg total) by mouth daily.    Phosphatidylserine-DHA-EPA 100-19.5-6.5 MG CAPS Take 1 capsule by mouth daily.   sertraline (ZOLOFT) 100 MG tablet TAKE 1 TABLET BY MOUTH DAILY. MUST BE MANUFACTURED BY [employer]. PATIENT DOES NOT TOLERATE MEDICATION BY OTHER MANUFACTURERS.   [DISCONTINUED] COVID-19 At Home Antigen Test [facility] COVID-19 HOME TEST) KIT Use as directed (Patient not taking: Reported on 10/17/2021)   [DISCONTINUED] methylPREDNISolone (MEDROL) 4 MG TBPK tablet Take 1 dose pk by oral route. (Patient not taking: Reported on 10/17/2021)   No facility-administered medications prior to visit.    Review of Systems  Constitutional:  Negative for appetite change, chills, fatigue and fever.  Respiratory:  Negative for chest tightness and shortness of breath.   Cardiovascular:  Negative for chest pain and palpitations.  Gastrointestinal:  Negative for abdominal pain, nausea and vomiting.  Neurological:  Negative for dizziness and weakness.       Objective    BP 112/61 (BP Location: Left Arm, Patient Position: Sitting, Cuff Size: Normal)   Pulse 61   Temp (!) 97.5 F (36.4 C) (Oral)   Resp 12   Ht _0  (1.651 m)   SpO2 99% Comment: room air  BMI 19.01 kg/m    Physical Exam   General appearance: Thin female, cooperative and in no acute distress Head: Normocephalic, without obvious abnormality, atraumatic Respiratory: Respirations even and unlabored, normal respiratory rate Extremities: All extremities are intact.  Skin: Skin color, texture, turgor normal. No rashes seen  Psych: Appropriate mood  and affect. Neurologic: Mental status: Alert, oriented to person, place, and time, thought content appropriate.    Assessment & Plan     1. Post menopausal syndrome refill - estrogens, conjugated, (PREMARIN) 1.25 MG tablet; Take 1 tablet (1.25 mg total) by mouth daily.  Dispense: 90 tablet; Refill: 4  2. Gastroesophageal reflux disease without esophagitis Well controlled on intermittent use of omeprazole.   3.  Irritable bowel syndrome, unspecified type refill- clidinium-chlordiazePOXIDE (LIBRAX) 5-2.5 MG capsule; TAKE 1 CAPSULE BY MOUTH 2 TIMES DAILY AS NEEDED.  Dispense: 60 capsule; Refill: 1  4. Anxiety Well controlled.  Continue current medications.  refill sertraline (ZOLOFT) 100 MG tablet; TAKE 1 TABLET BY MOUTH DAILY. MUST BE MANUFACTURED BY [employer]. PATIENT DOES NOT TOLERATE MEDICATION BY OTHER MANUFACTURERS.  Dispense: 90 tablet; Refill: 4      The entirety of the information documented in the History of Present Illness, Review of Systems and Physical Exam were personally obtained by me. Portions of this information were initially documented by the CMA and reviewed by me for thoroughness and accuracy.     Lelon Huh, MD  [facility] [phone] (phone) [phone] (fax)  [place]

## 2021-10-18 ENCOUNTER — Other Ambulatory Visit: Payer: Self-pay

## 2021-10-25 ENCOUNTER — Telehealth: Payer: Self-pay

## 2021-10-25 NOTE — Telephone Encounter (Signed)
LMOVM returning her call please call back

## 2021-10-26 ENCOUNTER — Telehealth: Payer: Self-pay

## 2021-10-26 NOTE — Telephone Encounter (Signed)
Patient called regarding issues with hands/fingers. She states Dr. Nehemiah Massed had given her medication before but its been longer then 3 years since patient was last seen. Advised patient we do not have any openings and I can not send in medication since she has not been in the office.  Advised patient to call PCP to see if they could get in her in. Patient states she will call her husband's office. aw

## 2021-11-04 DIAGNOSIS — M7501 Adhesive capsulitis of right shoulder: Secondary | ICD-10-CM | POA: Diagnosis not present

## 2021-11-18 ENCOUNTER — Encounter: Payer: Self-pay | Admitting: Physician Assistant

## 2021-11-18 ENCOUNTER — Other Ambulatory Visit (HOSPITAL_COMMUNITY)
Admission: RE | Admit: 2021-11-18 | Discharge: 2021-11-18 | Disposition: A | Discharge status: Home / Self Care | Payer: 59 | Source: Ambulatory Visit | Attending: Physician Assistant | Admitting: Physician Assistant

## 2021-11-18 ENCOUNTER — Encounter: Payer: 59 | Admitting: Family Medicine

## 2021-11-18 ENCOUNTER — Ambulatory Visit (INDEPENDENT_AMBULATORY_CARE_PROVIDER_SITE_OTHER): Payer: 59 | Admitting: Physician Assistant

## 2021-11-18 VITALS — BP 97/63 | HR 68 | Temp 97.9°F | Ht 64.0 in | Wt 120.0 lb

## 2021-11-18 DIAGNOSIS — Z23 Encounter for immunization: Secondary | ICD-10-CM

## 2021-11-18 DIAGNOSIS — K219 Gastro-esophageal reflux disease without esophagitis: Secondary | ICD-10-CM

## 2021-11-18 DIAGNOSIS — Z124 Encounter for screening for malignant neoplasm of cervix: Secondary | ICD-10-CM | POA: Insufficient documentation

## 2021-11-18 DIAGNOSIS — Z0000 Encounter for general adult medical examination without abnormal findings: Secondary | ICD-10-CM | POA: Diagnosis not present

## 2021-11-18 DIAGNOSIS — Z87898 Personal history of other specified conditions: Secondary | ICD-10-CM

## 2021-11-18 DIAGNOSIS — Z1231 Encounter for screening mammogram for malignant neoplasm of breast: Secondary | ICD-10-CM | POA: Diagnosis not present

## 2021-11-18 NOTE — Progress Notes (Signed)
I,Sha'taria Tyson,acting as a Education administrator for [employer], PA-C.,have documented all relevant documentation on the behalf of [name], PA-C,as directed by  [name], PA-C while in the presence of [name], PA-C.   Complete physical exam   Patient: Alyssa Humphrey   DOB: 03-21-1966   56 y.o. Female  MRN: [id number] Visit Date: 11/18/2021  Today's healthcare provider: [name], PA-C   Cc.cpe  Subjective    Alyssa Humphrey is a 56 y.o. female who presents today for a complete physical exam.  She reports consuming a general diet.  The patient report working out Monday, Wednesday and Friday and some Saturdays for at least a hour with various routines.  She generally feels well. She reports sleeping well. She does not have additional problems to discuss today.    Past Medical History:  Diagnosis Date   Acid reflux 03/15/2015   Adaptation reaction 03/15/2015   Dermatitis, eczematoid 03/15/2015   hand    Family history of Alzheimer's disease 03/15/2015   Impingement syndrome of shoulder 08/11/2002   Injected per Ortho    Patella-femoral syndrome 04/25/2010   treated by Dr Cipriano Mile    Post menopausal syndrome 03/15/2015   Skin lesion 09/28/2003   Lesion on right chin removed by Dr Debe Coder Clarcke-Compound nevus    Stress incontinence 01/05/2009   Treated by Dr. Arlyn Leak    Past Surgical History:  Procedure Laterality Date   ABDOMINAL HYSTERECTOMY  2007   CESAREAN SECTION  04/26/92; 01/15/96; 12/18/99   COLONOSCOPY WITH PROPOFOL N/A 12/28/2020   Procedure: COLONOSCOPY WITH PROPOFOL;  Surgeon: Lucilla Lame, MD;  Location: [facility] ENDOSCOPY;  Service: Endoscopy;  Laterality: N/A;   DILATION AND CURETTAGE OF UTERUS     x's 2   ESOPHAGOGASTRODUODENOSCOPY N/A 12/28/2020   Procedure: ESOPHAGOGASTRODUODENOSCOPY (EGD);  Surgeon: Lucilla Lame, MD;  Location: [facility] ENDOSCOPY;  Service: Endoscopy;  Laterality: N/A;   KNEE SURGERY Left    NASAL SINUS SURGERY     Social History    Socioeconomic History   Marital status: Married    Spouse name: Not on file   Number of children: Not on file   Years of education: Not on file   Highest education level: Not on file  Occupational History   Not on file  Tobacco Use   Smoking status: Never   Smokeless tobacco: Never  Vaping Use   Vaping Use: Never used  Substance and Sexual Activity   Alcohol use: Yes    Alcohol/week: 2.0 standard drinks of alcohol    Types: 2 Glasses of wine per week   Drug use: No   Sexual activity: Yes    Birth control/protection: Surgical  Other Topics Concern   Not on file  Social History Narrative   Not on file   Social Determinants of Health   Financial Resource Strain: Not on file  Food Insecurity: Not on file  Transportation Needs: Not on file  Physical Activity: Not on file  Stress: Not on file  Social Connections: Not on file  Intimate Partner Violence: Not on file   Family Status  Relation Name Status   Mother  Alive   Father  Deceased at age 67   Sister  40   Sister  Alive   Neg Hx  (Not Specified)   Family History  Problem Relation Age of Onset   Dementia Mother    Osteoarthritis Mother    Hypertension Mother    Hypertension Father    Osteoarthritis  Father    Healthy Sister    Healthy Sister    Kidney cancer Neg Hx    Breast cancer Neg Hx    No Known Allergies  Patient Care Team: Birdie Sons, MD as PCP - General (Family Medicine) Anell Barr, OD (Optometry)   Medications: Outpatient Medications Prior to Visit  Medication Sig   clidinium-chlordiazePOXIDE (LIBRAX) 5-2.5 MG capsule TAKE 1 CAPSULE BY MOUTH 2 TIMES DAILY AS NEEDED.   estrogens, conjugated, (PREMARIN) 1.25 MG tablet Take 1 tablet (1.25 mg total) by mouth daily.   meloxicam (MOBIC) 7.5 MG tablet Take 1 tablet twice a day by oral route.   omeprazole (PRILOSEC) 20 MG capsule Take 1 capsule (20 mg total) by mouth daily.   Phosphatidylserine-DHA-EPA 100-19.5-6.5 MG CAPS Take 1  capsule by mouth daily.   sertraline (ZOLOFT) 100 MG tablet TAKE 1 TABLET BY MOUTH DAILY. MUST BE MANUFACTURED BY [employer]. PATIENT DOES NOT TOLERATE MEDICATION BY OTHER MANUFACTURERS.   gatifloxacin (ZYMAXID) 0.5 % SOLN 1 GTT AFFECTED EYE EVERY 1-2 HOURS TODAY, THEN QID X 5 DAYS (Patient not taking: Reported on 11/18/2021)   No facility-administered medications prior to visit.    Review of Systems  Constitutional:  Negative for fatigue and fever.  Respiratory:  Negative for cough and shortness of breath.   Cardiovascular:  Negative for chest pain and leg swelling.  Gastrointestinal:  Negative for abdominal pain.  Neurological:  Negative for dizziness and headaches.     Objective      Blood pressure 97/63, pulse 68, temperature 97.9 F (36.6 C), temperature source Oral, height '5\' 4"'$  (1.626 m), weight 120 lb (54.4 kg), SpO2 100 %.    Physical Exam Constitutional:      General: She is awake.     Appearance: She is well-developed. She is not ill-appearing.  HENT:     Head: Normocephalic.     Right Ear: Tympanic membrane normal.     Left Ear: Tympanic membrane normal.     Nose: Nose normal. No congestion or rhinorrhea.     Mouth/Throat:     Pharynx: No oropharyngeal exudate or posterior oropharyngeal erythema.  Eyes:     Conjunctiva/sclera: Conjunctivae normal.     Pupils: Pupils are equal, round, and reactive to light.  Neck:     Thyroid: No thyroid mass or thyromegaly.  Cardiovascular:     Rate and Rhythm: Normal rate and regular rhythm.     Heart sounds: Normal heart sounds.  Pulmonary:     Effort: Pulmonary effort is normal.     Breath sounds: Normal breath sounds.  Chest:     Comments: B/l breasts without masses, skin changes, nipple discharge. Abdominal:     Palpations: Abdomen is soft.     Tenderness: There is no abdominal tenderness.  Genitourinary:    Cervix: No friability or lesion.  Musculoskeletal:     Right lower leg: No swelling. No edema.     Left lower  leg: No swelling. No edema.  Lymphadenopathy:     Cervical: No cervical adenopathy.  Skin:    General: Skin is warm.  Neurological:     Mental Status: She is alert and oriented to person, place, and time.  Psychiatric:        Attention and Perception: Attention normal.        Mood and Affect: Mood normal.        Speech: Speech normal.        Behavior: Behavior normal. Behavior is cooperative.  Last depression screening scores    11/18/2021    2:38 PM 10/17/2021   10:32 AM 10/18/2020    6:14 PM  PHQ 2/9 Scores  PHQ - 2 Score 0 0 0  PHQ- 9 Score 0 1    Last fall risk screening    11/18/2021    2:37 PM  [place] in the past year? 0  Number falls in past yr: 0  Injury with Fall? 0  Risk for fall due to : No Fall Risks   Last Audit-C alcohol use screening    11/18/2021    2:37 PM  Alcohol Use Disorder Test (AUDIT)  1. How often do you have a drink containing alcohol? 3  2. How many drinks containing alcohol do you have on a typical day when you are drinking? 0  3. How often do you have six or more drinks on one occasion? 0  AUDIT-C Score 3   A score of 3 or more in women, and 4 or more in men indicates increased risk for alcohol abuse, EXCEPT if all of the points are from question 1   No results found for any visits on 11/18/21.  Assessment & Plan    Routine Health Maintenance and Physical Exam  Exercise Activities and Dietary recommendations  Goals      Exercise 150 minutes per week (moderate activity)        Immunization History  Administered Date(s) Administered   Influenza,inj,Quad PF,6+ Mos 04/23/2015, 05/27/2016, 02/18/2018, 02/18/2019   PFIZER(Purple Top)SARS-COV-2 Vaccination 07/04/2019, 07/25/2019, 04/14/2020   Tdap 10/18/2020   Zoster Recombinat (Shingrix) 10/18/2020, 11/18/2021    Health Maintenance  Topic Date Due   HIV Screening  Never done   PAP SMEAR-Modifier  04/11/2020   COVID-19 Vaccine (4 - Pfizer series) 12/04/2021  (Originally 06/09/2020)   INFLUENZA VACCINE  12/06/2021   MAMMOGRAM  01/28/2022   TETANUS/TDAP  10/19/2030   COLONOSCOPY (Pts 45-26yr Insurance coverage will need to be confirmed)  12/29/2030   Hepatitis C Screening  Completed   Zoster Vaccines- Shingrix  Completed   HPV VACCINES  Aged Out    Discussed health benefits of physical activity, and encouraged her to engage in regular exercise appropriate for her age and condition.  Problem List Items Addressed This Visit       Digestive   Acid reflux    Stable on omeprazole      Other Visit Diagnoses     Annual physical exam    -  Primary   Relevant Orders   Comprehensive Metabolic Panel (CMET)   Lipid Profile   CBC w/Diff/Platelet   History of prediabetes       Relevant Orders   HgB A1c   Need for shingles vaccine       Relevant Orders   Varicella-zoster vaccine IM (Shingrix) (Completed)   Cervical cancer screening       Relevant Orders   Cytology - PAP   Breast cancer screening by mammogram       Relevant Orders   MM 3D SCREEN BREAST BILATERAL        Return in about 1 year (around 11/19/2022) for CPE.     I, LMikey Kirschner PA-C have reviewed all documentation for this visit. The documentation on  11/18/2021 for the exam, diagnosis, procedures, and orders are all accurate and complete. LMikey Kirschner PA-C [facility] [place] [address]Office: [phone]: [phone]

## 2021-11-18 NOTE — Assessment & Plan Note (Signed)
Stable on omeprazole. 

## 2021-11-19 LAB — CBC WITH DIFFERENTIAL/PLATELET
Basophils Absolute: 0.1 10*3/uL (ref 0.0–0.2)
Basos: 1 %
EOS (ABSOLUTE): 0.1 10*3/uL (ref 0.0–0.4)
Eos: 2 %
Hematocrit: 37.3 % (ref 34.0–46.6)
Hemoglobin: 12 g/dL (ref 11.1–15.9)
Immature Grans (Abs): 0 10*3/uL (ref 0.0–0.1)
Immature Granulocytes: 0 %
Lymphocytes Absolute: 1.9 10*3/uL (ref 0.7–3.1)
Lymphs: 39 %
MCH: 29.6 pg (ref 26.6–33.0)
MCHC: 32.2 g/dL (ref 31.5–35.7)
MCV: 92 fL (ref 79–97)
Monocytes Absolute: 0.5 10*3/uL (ref 0.1–0.9)
Monocytes: 10 %
Neutrophils Absolute: 2.5 10*3/uL (ref 1.4–7.0)
Neutrophils: 48 %
Platelets: 213 10*3/uL (ref 150–450)
RBC: 4.06 x10E6/uL (ref 3.77–5.28)
RDW: 12.9 % (ref 11.7–15.4)
WBC: 5 10*3/uL (ref 3.4–10.8)

## 2021-11-19 LAB — COMPREHENSIVE METABOLIC PANEL
ALT: 15 IU/L (ref 0–32)
AST: 20 IU/L (ref 0–40)
Albumin/Globulin Ratio: 1.8 (ref 1.2–2.2)
Albumin: 4.4 g/dL (ref 3.8–4.9)
Alkaline Phosphatase: 51 IU/L (ref 44–121)
BUN/Creatinine Ratio: 15 (ref 9–23)
BUN: 11 mg/dL (ref 6–24)
Bilirubin Total: 0.3 mg/dL (ref 0.0–1.2)
CO2: 25 mmol/L (ref 20–29)
Calcium: 9.5 mg/dL (ref 8.7–10.2)
Chloride: 101 mmol/L (ref 96–106)
Creatinine, Ser: 0.73 mg/dL (ref 0.57–1.00)
Globulin, Total: 2.4 g/dL (ref 1.5–4.5)
Glucose: 82 mg/dL (ref 70–99)
Potassium: 4.2 mmol/L (ref 3.5–5.2)
Sodium: 138 mmol/L (ref 134–144)
Total Protein: 6.8 g/dL (ref 6.0–8.5)
eGFR: 96 mL/min/{1.73_m2} (ref >59)

## 2021-11-19 LAB — LIPID PANEL
Chol/HDL Ratio: 2.8 ratio (ref 0.0–4.4)
Cholesterol, Total: 193 mg/dL (ref 100–199)
HDL: 69 mg/dL (ref >39)
LDL Chol Calc (NIH): 105 mg/dL — ABNORMAL HIGH (ref 0–99)
Triglycerides: 107 mg/dL (ref 0–149)
VLDL Cholesterol Cal: 19 mg/dL (ref 5–40)

## 2021-11-19 LAB — HEMOGLOBIN A1C
Est. average glucose Bld gHb Est-mCnc: 111 mg/dL
Hgb A1c MFr Bld: 5.5 % (ref 4.8–5.6)

## 2021-11-23 LAB — CYTOLOGY - PAP
Comment: NEGATIVE
Diagnosis: NEGATIVE
High risk HPV: NEGATIVE

## 2021-12-16 ENCOUNTER — Other Ambulatory Visit: Payer: Self-pay

## 2021-12-16 DIAGNOSIS — H16042 Marginal corneal ulcer, left eye: Secondary | ICD-10-CM | POA: Diagnosis not present

## 2021-12-16 MED ORDER — TOBRAMYCIN-DEXAMETHASONE 0.3-0.1 % OP SUSP
OPHTHALMIC | 0 refills | Status: DC
  Filled 2021-12-16: qty 5, 18d supply, fill #0

## 2022-01-10 ENCOUNTER — Ambulatory Visit: Payer: 59 | Admitting: Dermatology

## 2022-01-10 DIAGNOSIS — L814 Other melanin hyperpigmentation: Secondary | ICD-10-CM | POA: Diagnosis not present

## 2022-01-10 DIAGNOSIS — L578 Other skin changes due to chronic exposure to nonionizing radiation: Secondary | ICD-10-CM

## 2022-01-10 DIAGNOSIS — L570 Actinic keratosis: Secondary | ICD-10-CM | POA: Diagnosis not present

## 2022-01-10 NOTE — Progress Notes (Signed)
   New Patient Visit  Subjective  Alyssa Humphrey is a 56 y.o. female who presents for the following: Pink spot (Left nose x years, off and on. On occasion area has bled when rubbed, sometimes rough and flaky. Not growing. ).   The following portions of the chart were reviewed this encounter and updated as appropriate:       Review of Systems:  No other skin or systemic complaints except as noted in HPI or Assessment and Plan.  Objective  Well appearing patient in no apparent distress; mood and affect are within normal limits.  A focused examination was performed including face. Relevant physical exam findings are noted in the Assessment and Plan.  Left Nasal Dorsum 5.0 mm pink scaly macule, lighter central       Assessment & Plan  Actinic Damage - chronic, secondary to cumulative UV radiation exposure/sun exposure over time - diffuse scaly erythematous macules with underlying dyspigmentation - Recommend daily broad spectrum sunscreen SPF 30+ to sun-exposed areas, reapply every 2 hours as needed.  - Recommend staying in the shade or wearing long sleeves, sun glasses (UVA+UVB protection) and wide brim hats (4-inch brim around the entire circumference of the hat). - Call for new or changing lesions.  Lentigines - Scattered tan macules - Due to sun exposure - Benign-appering, observe - Recommend daily broad spectrum sunscreen SPF 30+ to sun-exposed areas, reapply every 2 hours as needed. - Call for any changes  AK (actinic keratosis) Left Nasal Dorsum  Recheck on follow-up. Discussed biopsy if not improved.   Actinic keratoses are precancerous spots that appear secondary to cumulative UV radiation exposure/sun exposure over time. They are chronic with expected duration over 1 year. A portion of actinic keratoses will progress to squamous cell carcinoma of the skin. It is not possible to reliably predict which spots will progress to skin cancer and so treatment is recommended  to prevent development of skin cancer.  Recommend daily broad spectrum sunscreen SPF 30+ to sun-exposed areas, reapply every 2 hours as needed.  Recommend staying in the shade or wearing long sleeves, sun glasses (UVA+UVB protection) and wide brim hats (4-inch brim around the entire circumference of the hat). Call for new or changing lesions.  Destruction of lesion - Left Nasal Dorsum  Destruction method: cryotherapy   Informed consent: discussed and consent obtained   Lesion destroyed using liquid nitrogen: Yes   Region frozen until ice ball extended beyond lesion: Yes   Outcome: patient tolerated procedure well with no complications   Post-procedure details: wound care instructions given   Additional details:  Prior to procedure, discussed risks of blister formation, small wound, skin dyspigmentation, or rare scar following cryotherapy. Recommend Vaseline ointment to treated areas while healing.    Return in about 2 months (around 03/12/2022) for recheck AK .  IJamesetta Orleans, CMA, am acting as scribe for Brendolyn Patty, MD .  Documentation: I have reviewed the above documentation for accuracy and completeness, and I agree with the above.  Brendolyn Patty MD

## 2022-01-10 NOTE — Patient Instructions (Addendum)
Cryotherapy Aftercare  Wash gently with soap and water everyday.   Apply Vaseline and Band-Aid daily until healed.     Due to recent changes in healthcare laws, you may see results of your pathology and/or laboratory studies on MyChart before the doctors have had a chance to review them. We understand that in some cases there may be results that are confusing or concerning to you. Please understand that not all results are received at the same time and often the doctors may need to interpret multiple results in order to provide you with the best plan of care or course of treatment. Therefore, we ask that you please give us 2 business days to thoroughly review all your results before contacting the office for clarification. Should we see a critical lab result, you will be contacted sooner.   If You Need Anything After Your Visit  If you have any questions or concerns for your doctor, please call our main line at [phone] and press option 4 to reach your doctor's medical assistant. If no one answers, please leave a voicemail as directed and we will return your call as soon as possible. Messages left after 4 pm will be answered the following business day.   You may also send us a message via MyChart. We typically respond to MyChart messages within 1-2 business days.  For prescription refills, please ask your pharmacy to contact our office. Our fax number is [phone].  If you have an urgent issue when the clinic is closed that cannot wait until the next business day, you can page your doctor at the number below.    Please note that while we do our best to be available for urgent issues outside of office hours, we are not available 24/7.   If you have an urgent issue and are unable to reach us, you may choose to seek medical care at your doctor's office, retail clinic, urgent care center, or emergency room.  If you have a medical emergency, please immediately call 911 or go to the  emergency department.  Pager Numbers  - Dr. Kowalski: [phone]  - Dr. Moye: [phone]  - Dr. Stewart: [phone]  In the event of inclement weather, please call our main line at [phone] for an update on the status of any delays or closures.  Dermatology Medication Tips: Please keep the boxes that topical medications come in in order to help keep track of the instructions about where and how to use these. Pharmacies typically print the medication instructions only on the boxes and not directly on the medication tubes.   If your medication is too expensive, please contact our office at [phone] option 4 or send us a message through MyChart.   We are unable to tell what your co-pay for medications will be in advance as this is different depending on your insurance coverage. However, we may be able to find a substitute medication at lower cost or fill out paperwork to get insurance to cover a needed medication.   If a prior authorization is required to get your medication covered by your insurance company, please allow us 1-2 business days to complete this process.  Drug prices often vary depending on where the prescription is filled and some pharmacies may offer cheaper prices.  The website www.goodrx.com contains coupons for medications through different pharmacies. The prices here do not account for what the cost may be with help from insurance (it may be cheaper with your insurance), but the website can   give you the price if you did not use any insurance.  - You can print the associated coupon and take it with your prescription to the pharmacy.  - You may also stop by our office during regular business hours and pick up a GoodRx coupon card.  - If you need your prescription sent electronically to a different pharmacy, notify our office through  MyChart or by phone at [phone] option 4.     Si Usted Necesita Algo Despus de Su Visita  Tambin puede  enviarnos un mensaje a travs de MyChart. Por lo general respondemos a los mensajes de MyChart en el transcurso de 1 a 2 das hbiles.  Para renovar recetas, por favor pida a su farmacia que se ponga en contacto con nuestra oficina. Nuestro nmero de fax es el [phone].  Si tiene un asunto urgente cuando la clnica est cerrada y que no puede esperar hasta el siguiente da hbil, puede llamar/localizar a su doctor(a) al nmero que aparece a continuacin.   Por favor, tenga en cuenta que aunque hacemos todo lo posible para estar disponibles para asuntos urgentes fuera del horario de oficina, no estamos disponibles las 24 horas del da, los 7 das de la semana.   Si tiene un problema urgente y no puede comunicarse con nosotros, puede optar por buscar atencin mdica  en el consultorio de su doctor(a), en una clnica privada, en un centro de atencin urgente o en una sala de emergencias.  Si tiene una emergencia mdica, por favor llame inmediatamente al 911 o vaya a la sala de emergencias.  Nmeros de bper  - Dr. Kowalski: [phone]  - Dra. Moye: [phone]  - Dra. Stewart: [phone]  En caso de inclemencias del tiempo, por favor llame a nuestra lnea principal al [phone] para una actualizacin sobre el estado de cualquier retraso o cierre.  Consejos para la medicacin en dermatologa: Por favor, guarde las cajas en las que vienen los medicamentos de uso tpico para ayudarle a seguir las instrucciones sobre dnde y cmo usarlos. Las farmacias generalmente imprimen las instrucciones del medicamento slo en las cajas y no directamente en los tubos del medicamento.   Si su medicamento es muy caro, por favor, pngase en contacto con nuestra oficina llamando al [phone] y presione la opcin 4 o envenos un mensaje a travs de MyChart.   No podemos decirle cul ser su copago por los medicamentos por adelantado ya que esto es diferente dependiendo de la cobertura de su seguro.  Sin embargo, es posible que podamos encontrar un medicamento sustituto a menor costo o llenar un formulario para que el seguro cubra el medicamento que se considera necesario.   Si se requiere una autorizacin previa para que su compaa de seguros cubra su medicamento, por favor permtanos de 1 a 2 das hbiles para completar este proceso.  Los precios de los medicamentos varan con frecuencia dependiendo del lugar de dnde se surte la receta y alguna farmacias pueden ofrecer precios ms baratos.  El sitio web www.goodrx.com tiene cupones para medicamentos de diferentes farmacias. Los precios aqu no tienen en cuenta lo que podra costar con la ayuda del seguro (puede ser ms barato con su seguro), pero el sitio web puede darle el precio si no utiliz ningn seguro.  - Puede imprimir el cupn correspondiente y llevarlo con su receta a la farmacia.  - Tambin puede pasar por nuestra oficina durante el horario de atencin regular y recoger una tarjeta de cupones de GoodRx.  -   Si necesita que su receta se enve electrnicamente a una farmacia diferente, informe a nuestra oficina a travs de MyChart de [place] o por telfono llamando al [phone] y presione la opcin 4.  

## 2022-01-20 DIAGNOSIS — H0288B Meibomian gland dysfunction left eye, upper and lower eyelids: Secondary | ICD-10-CM | POA: Diagnosis not present

## 2022-01-20 DIAGNOSIS — H0288A Meibomian gland dysfunction right eye, upper and lower eyelids: Secondary | ICD-10-CM | POA: Diagnosis not present

## 2022-01-20 DIAGNOSIS — H04123 Dry eye syndrome of bilateral lacrimal glands: Secondary | ICD-10-CM | POA: Diagnosis not present

## 2022-01-20 DIAGNOSIS — H01024 Squamous blepharitis left upper eyelid: Secondary | ICD-10-CM | POA: Diagnosis not present

## 2022-01-20 DIAGNOSIS — H01021 Squamous blepharitis right upper eyelid: Secondary | ICD-10-CM | POA: Diagnosis not present

## 2022-01-27 ENCOUNTER — Other Ambulatory Visit: Payer: Self-pay

## 2022-01-27 MED ORDER — FLUOROMETHOLONE 0.1 % OP SUSP
OPHTHALMIC | 1 refills | Status: AC
  Filled 2022-01-27 (×2): qty 5, 14d supply, fill #0
  Filled 2022-01-27: qty 5, 30d supply, fill #0

## 2022-01-30 ENCOUNTER — Ambulatory Visit
Admission: RE | Admit: 2022-01-30 | Discharge: 2022-01-30 | Disposition: A | Discharge status: Home / Self Care | Payer: 59 | Source: Ambulatory Visit | Attending: Physician Assistant | Admitting: Physician Assistant

## 2022-01-30 DIAGNOSIS — Z1231 Encounter for screening mammogram for malignant neoplasm of breast: Secondary | ICD-10-CM | POA: Diagnosis not present

## 2022-03-14 ENCOUNTER — Ambulatory Visit: Payer: 59 | Admitting: Dermatology

## 2022-03-14 DIAGNOSIS — D229 Melanocytic nevi, unspecified: Secondary | ICD-10-CM | POA: Diagnosis not present

## 2022-03-14 DIAGNOSIS — Z872 Personal history of diseases of the skin and subcutaneous tissue: Secondary | ICD-10-CM | POA: Diagnosis not present

## 2022-03-14 DIAGNOSIS — L578 Other skin changes due to chronic exposure to nonionizing radiation: Secondary | ICD-10-CM

## 2022-03-14 DIAGNOSIS — L821 Other seborrheic keratosis: Secondary | ICD-10-CM | POA: Diagnosis not present

## 2022-03-14 DIAGNOSIS — L814 Other melanin hyperpigmentation: Secondary | ICD-10-CM | POA: Diagnosis not present

## 2022-03-14 DIAGNOSIS — D2261 Melanocytic nevi of right upper limb, including shoulder: Secondary | ICD-10-CM | POA: Diagnosis not present

## 2022-03-14 NOTE — Progress Notes (Signed)
   Follow-Up Visit   Subjective  Alyssa Humphrey is a 56 y.o. female who presents for the following: Actinic Keratosis (2 mth follow up. Patient reports area at left nasal dorsum has went away. ).    The patient has spots, moles and lesions to be evaluated, some may be new or changing and the patient has concerns that these could be cancer.   The following portions of the chart were reviewed this encounter and updated as appropriate:      Review of Systems: No other skin or systemic complaints except as noted in HPI or Assessment and Plan.   Objective  Well appearing patient in no apparent distress; mood and affect are within normal limits.  A focused examination was performed including face and left nasal dorsum b/l lower legs, back and b/l arms. Relevant physical exam findings are noted in the Assessment and Plan.  right upper inner arm 1.5 mm dark brown macule         Assessment & Plan  Nevus right upper inner arm  Benign-appearing.  Observation.  Call clinic for new or changing lesions.  Recommend daily use of broad spectrum spf 30+ sunscreen to sun-exposed areas.   Recheck on f/up.  ABCDEs of mole observation discussed.       Seborrheic Keratoses - Stuck-on, waxy, tan-brown papules and/or plaques  - Benign-appearing - Discussed benign etiology and prognosis. - Observe - Call for any changes  Lentigines - Scattered tan macules - Due to sun exposure - Benign-appearing, observe - Recommend daily broad spectrum sunscreen SPF 30+ to sun-exposed areas, reapply every 2 hours as needed. - Call for any changes  Melanocytic Nevi - Tan-brown and/or pink-flesh-colored symmetric macules and papules - Benign appearing on exam today - Observation - Call clinic for new or changing moles - Recommend daily use of broad spectrum spf 30+ sunscreen to sun-exposed areas.    Actinic Damage - chronic, secondary to cumulative UV radiation exposure/sun exposure over  time - diffuse scaly erythematous macules with underlying dyspigmentation - Recommend daily broad spectrum sunscreen SPF 30+ to sun-exposed areas, reapply every 2 hours as needed.  - Recommend staying in the shade or wearing long sleeves, sun glasses (UVA+UVB protection) and wide brim hats (4-inch brim around the entire circumference of the hat). - Call for new or changing lesions.  History of PreCancerous Actinic Keratosis  Left nasal dorsum  - site(s) of PreCancerous Actinic Keratosis clear today. - these may recur and new lesions may form requiring treatment to prevent transformation into skin cancer - observe for new or changing spots and contact [facility] for appointment if occur - photoprotection with sun protective clothing; sunglasses and broad spectrum sunscreen with SPF of at least 30 + and frequent self skin exams recommended - yearly exams by a dermatologist recommended for persons with history of PreCancerous Actinic Keratoses  Return in about 6 months (around 09/12/2022) for recheck nose and recheck right upper inner arm. I, Ruthell Rummage, CMA, am acting as scribe for Brendolyn Patty, MD.  Documentation: I have reviewed the above documentation for accuracy and completeness, and I agree with the above.  Brendolyn Patty MD

## 2022-03-14 NOTE — Patient Instructions (Addendum)
     Melanoma ABCDEs  Melanoma is the most dangerous type of skin cancer, and is the leading cause of death from skin disease.  You are more likely to develop melanoma if you: Have light-colored skin, light-colored eyes, or red or blond hair Spend a lot of time in the sun Tan regularly, either outdoors or in a tanning bed Have had blistering sunburns, especially during childhood Have a close family member who has had a melanoma Have atypical moles or large birthmarks  Early detection of melanoma is key since treatment is typically straightforward and cure rates are extremely high if we catch it early.   The first sign of melanoma is often a change in a mole or a new dark spot.  The ABCDE system is a way of remembering the signs of melanoma.  A for asymmetry:  The two halves do not match. B for border:  The edges of the growth are irregular. C for color:  A mixture of colors are present instead of an even brown color. D for diameter:  Melanomas are usually (but not always) greater than 6mm - the size of a pencil eraser. E for evolution:  The spot keeps changing in size, shape, and color.  Please check your skin once per month between visits. You can use a small mirror in front and a large mirror behind you to keep an eye on the back side or your body.   If you see any new or changing lesions before your next follow-up, please call to schedule a visit.  Please continue daily skin protection including broad spectrum sunscreen SPF 30+ to sun-exposed areas, reapplying every 2 hours as needed when you're outdoors.   Staying in the shade or wearing long sleeves, sun glasses (UVA+UVB protection) and wide brim hats (4-inch brim around the entire circumference of the hat) are also recommended for sun protection.    Due to recent changes in healthcare laws, you may see results of your pathology and/or laboratory studies on MyChart before the doctors have had a chance to review them. We  understand that in some cases there may be results that are confusing or concerning to you. Please understand that not all results are received at the same time and often the doctors may need to interpret multiple results in order to provide you with the best plan of care or course of treatment. Therefore, we ask that you please give us 2 business days to thoroughly review all your results before contacting the office for clarification. Should we see a critical lab result, you will be contacted sooner.   If You Need Anything After Your Visit  If you have any questions or concerns for your doctor, please call our main line at [phone] and press option 4 to reach your doctor's medical assistant. If no one answers, please leave a voicemail as directed and we will return your call as soon as possible. Messages left after 4 pm will be answered the following business day.   You may also send us a message via MyChart. We typically respond to MyChart messages within 1-2 business days.  For prescription refills, please ask your pharmacy to contact our office. Our fax number is [phone].  If you have an urgent issue when the clinic is closed that cannot wait until the next business day, you can page your doctor at the number below.    Please note that while we do our best to be available for urgent issues   outside of office hours, we are not available 24/7.   If you have an urgent issue and are unable to reach us, you may choose to seek medical care at your doctor's office, retail clinic, urgent care center, or emergency room.  If you have a medical emergency, please immediately call 911 or go to the emergency department.  Pager Numbers  - Dr. Kowalski: [phone]  - Dr. Moye: [phone]  - Dr. Stewart: [phone]  In the event of inclement weather, please call our main line at [phone] for an update on the status of any delays or closures.  Dermatology Medication Tips: Please  keep the boxes that topical medications come in in order to help keep track of the instructions about where and how to use these. Pharmacies typically print the medication instructions only on the boxes and not directly on the medication tubes.   If your medication is too expensive, please contact our office at [phone] option 4 or send us a message through MyChart.   We are unable to tell what your co-pay for medications will be in advance as this is different depending on your insurance coverage. However, we may be able to find a substitute medication at lower cost or fill out paperwork to get insurance to cover a needed medication.   If a prior authorization is required to get your medication covered by your insurance company, please allow us 1-2 business days to complete this process.  Drug prices often vary depending on where the prescription is filled and some pharmacies may offer cheaper prices.  The website www.goodrx.com contains coupons for medications through different pharmacies. The prices here do not account for what the cost may be with help from insurance (it may be cheaper with your insurance), but the website can give you the price if you did not use any insurance.  - You can print the associated coupon and take it with your prescription to the pharmacy.  - You may also stop by our office during regular business hours and pick up a GoodRx coupon card.  - If you need your prescription sent electronically to a different pharmacy, notify our office through [facility] MyChart or by phone at [phone] option 4.     Si Usted Necesita Algo Despus de Su Visita  Tambin puede enviarnos un mensaje a travs de MyChart. Por lo general respondemos a los mensajes de MyChart en el transcurso de 1 a 2 das hbiles.  Para renovar recetas, por favor pida a su farmacia que se ponga en contacto con nuestra oficina. Nuestro nmero de fax es el [phone].  Si tiene un asunto urgente  cuando la clnica est cerrada y que no puede esperar hasta el siguiente da hbil, puede llamar/localizar a su doctor(a) al nmero que aparece a continuacin.   Por favor, tenga en cuenta que aunque hacemos todo lo posible para estar disponibles para asuntos urgentes fuera del horario de oficina, no estamos disponibles las 24 horas del da, los 7 das de la semana.   Si tiene un problema urgente y no puede comunicarse con nosotros, puede optar por buscar atencin mdica  en el consultorio de su doctor(a), en una clnica privada, en un centro de atencin urgente o en una sala de emergencias.  Si tiene una emergencia mdica, por favor llame inmediatamente al 911 o vaya a la sala de emergencias.  Nmeros de bper  - Dr. Kowalski: [phone]  - Dra. Moye: [phone]  - Dra. Stewart: [phone]  En caso   de inclemencias del tiempo, por favor llame a nuestra lnea principal al [phone] para una actualizacin sobre el estado de cualquier retraso o cierre.  Consejos para la medicacin en dermatologa: Por favor, guarde las cajas en las que vienen los medicamentos de uso tpico para ayudarle a seguir las instrucciones sobre dnde y cmo usarlos. Las farmacias generalmente imprimen las instrucciones del medicamento slo en las cajas y no directamente en los tubos del medicamento.   Si su medicamento es muy caro, por favor, pngase en contacto con nuestra oficina llamando al [phone] y presione la opcin 4 o envenos un mensaje a travs de MyChart.   No podemos decirle cul ser su copago por los medicamentos por adelantado ya que esto es diferente dependiendo de la cobertura de su seguro. Sin embargo, es posible que podamos encontrar un medicamento sustituto a menor costo o llenar un formulario para que el seguro cubra el medicamento que se considera necesario.   Si se requiere una autorizacin previa para que su compaa de seguros cubra su medicamento, por favor permtanos de 1 a 2  das hbiles para completar este proceso.  Los precios de los medicamentos varan con frecuencia dependiendo del lugar de dnde se surte la receta y alguna farmacias pueden ofrecer precios ms baratos.  El sitio web www.goodrx.com tiene cupones para medicamentos de diferentes farmacias. Los precios aqu no tienen en cuenta lo que podra costar con la ayuda del seguro (puede ser ms barato con su seguro), pero el sitio web puede darle el precio si no utiliz ningn seguro.  - Puede imprimir el cupn correspondiente y llevarlo con su receta a la farmacia.  - Tambin puede pasar por nuestra oficina durante el horario de atencin regular y recoger una tarjeta de cupones de GoodRx.  - Si necesita que su receta se enve electrnicamente a una farmacia diferente, informe a nuestra oficina a travs de MyChart de [facility] o por telfono llamando al [phone] y presione la opcin 4.  

## 2022-04-03 ENCOUNTER — Other Ambulatory Visit: Payer: Self-pay

## 2022-04-04 ENCOUNTER — Other Ambulatory Visit: Payer: Self-pay

## 2022-05-19 ENCOUNTER — Other Ambulatory Visit: Payer: Self-pay

## 2022-05-22 ENCOUNTER — Other Ambulatory Visit: Payer: Self-pay

## 2022-07-13 ENCOUNTER — Telehealth: Payer: Self-pay | Admitting: Physician Assistant

## 2022-07-13 NOTE — Telephone Encounter (Signed)
Patient is needing Premarin 1.25 mg. To be sent to [facility] mail in pharmacy

## 2022-07-13 NOTE — Telephone Encounter (Signed)
LOV 11/18/21 NOV 11/20/22 LRF 10/17/21 #[id number]

## 2022-07-25 ENCOUNTER — Other Ambulatory Visit: Payer: Self-pay | Admitting: Physician Assistant

## 2022-07-25 ENCOUNTER — Other Ambulatory Visit: Payer: Self-pay

## 2022-07-25 DIAGNOSIS — N951 Menopausal and female climacteric states: Secondary | ICD-10-CM

## 2022-07-25 MED ORDER — ESTROGENS CONJUGATED 1.25 MG PO TABS: 1.2500 mg | ORAL_TABLET | Freq: Every day | ORAL | 3 refills | Status: DC

## 2022-07-25 MED ORDER — ESTROGENS CONJUGATED 1.25 MG PO TABS
1.2500 mg | ORAL_TABLET | Freq: Every day | ORAL | 0 refills | Status: DC
Start: 2022-07-25 — End: 2022-07-25
  Filled 2022-07-25: qty 30, 30d supply, fill #0

## 2022-07-26 ENCOUNTER — Other Ambulatory Visit: Payer: Self-pay

## 2022-07-28 ENCOUNTER — Encounter: Payer: Self-pay | Admitting: Physician Assistant

## 2022-07-28 ENCOUNTER — Telehealth: Payer: Self-pay | Admitting: Physician Assistant

## 2022-07-28 NOTE — Telephone Encounter (Signed)
[facility] calling to verify that pt.'s insurance will not cover Premarin, will cover estradiol. Please advise.

## 2022-08-01 MED ORDER — ESTRADIOL 1 MG PO TABS: 1.0000 mg | ORAL_TABLET | Freq: Every day | ORAL | 1 refills | Status: DC

## 2022-08-10 ENCOUNTER — Other Ambulatory Visit: Payer: Self-pay

## 2022-09-12 ENCOUNTER — Ambulatory Visit: Payer: 59 | Admitting: Dermatology

## 2022-10-13 ENCOUNTER — Other Ambulatory Visit: Payer: Self-pay

## 2022-10-13 DIAGNOSIS — F419 Anxiety disorder, unspecified: Secondary | ICD-10-CM

## 2022-10-13 DIAGNOSIS — K589 Irritable bowel syndrome without diarrhea: Secondary | ICD-10-CM

## 2022-10-14 MED ORDER — CILIDINIUM-CHLORDIAZEPOXIDE 2.5-5 MG PO CAPS
ORAL_CAPSULE | ORAL | 1 refills | Status: AC
Start: 2022-10-14 — End: 2023-10-14

## 2022-10-14 MED ORDER — SERTRALINE HCL 100 MG PO TABS: ORAL_TABLET | ORAL | 4 refills | Status: AC

## 2022-11-06 ENCOUNTER — Ambulatory Visit: Payer: 59 | Admitting: Dermatology

## 2022-11-06 DIAGNOSIS — L578 Other skin changes due to chronic exposure to nonionizing radiation: Secondary | ICD-10-CM

## 2022-11-06 DIAGNOSIS — D229 Melanocytic nevi, unspecified: Secondary | ICD-10-CM | POA: Diagnosis not present

## 2022-11-06 DIAGNOSIS — Z872 Personal history of diseases of the skin and subcutaneous tissue: Secondary | ICD-10-CM | POA: Diagnosis not present

## 2022-11-06 DIAGNOSIS — L814 Other melanin hyperpigmentation: Secondary | ICD-10-CM

## 2022-11-07 ENCOUNTER — Encounter: Payer: Self-pay | Admitting: Dermatology

## 2022-11-07 NOTE — Patient Instructions (Signed)
Due to recent changes in healthcare laws, you may see results of your pathology and/or laboratory studies on MyChart before the doctors have had a chance to review them. We understand that in some cases there may be results that are confusing or concerning to you. Please understand that not all results are received at the same time and often the doctors may need to interpret multiple results in order to provide you with the best plan of care or course of treatment. Therefore, we ask that you please give us 2 business days to thoroughly review all your results before contacting the office for clarification. Should we see a critical lab result, you will be contacted sooner.   If You Need Anything After Your Visit  If you have any questions or concerns for your doctor, please call our main line at [phone] and press option 4 to reach your doctor's medical assistant. If no one answers, please leave a voicemail as directed and we will return your call as soon as possible. Messages left after 4 pm will be answered the following business day.   You may also send us a message via MyChart. We typically respond to MyChart messages within 1-2 business days.  For prescription refills, please ask your pharmacy to contact our office. Our fax number is [phone].  If you have an urgent issue when the clinic is closed that cannot wait until the next business day, you can page your doctor at the number below.    Please note that while we do our best to be available for urgent issues outside of office hours, we are not available 24/7.   If you have an urgent issue and are unable to reach us, you may choose to seek medical care at your doctor's office, retail clinic, urgent care center, or emergency room.  If you have a medical emergency, please immediately call 911 or go to the emergency department.  Pager Numbers  - Dr. Kowalski: [phone]  - Dr. Moye: [phone]  - Dr. Stewart:  [phone]  In the event of inclement weather, please call our main line at [phone] for an update on the status of any delays or closures.  Dermatology Medication Tips: Please keep the boxes that topical medications come in in order to help keep track of the instructions about where and how to use these. Pharmacies typically print the medication instructions only on the boxes and not directly on the medication tubes.   If your medication is too expensive, please contact our office at [phone] option 4 or send us a message through MyChart.   We are unable to tell what your co-pay for medications will be in advance as this is different depending on your insurance coverage. However, we may be able to find a substitute medication at lower cost or fill out paperwork to get insurance to cover a needed medication.   If a prior authorization is required to get your medication covered by your insurance company, please allow us 1-2 business days to complete this process.  Drug prices often vary depending on where the prescription is filled and some pharmacies may offer cheaper prices.  The website www.goodrx.com contains coupons for medications through different pharmacies. The prices here do not account for what the cost may be with help from insurance (it may be cheaper with your insurance), but the website can give you the price if you did not use any insurance.  - You can print the associated coupon and take it with   your prescription to the pharmacy.  - You may also stop by our office during regular business hours and pick up a GoodRx coupon card.  - If you need your prescription sent electronically to a different pharmacy, notify our office through [facility] MyChart or by phone at [phone] option 4.     Si Usted Necesita Algo Despus de Su Visita  Tambin puede enviarnos un mensaje a travs de MyChart. Por lo general respondemos a los mensajes de MyChart en el transcurso de 1 a 2  das hbiles.  Para renovar recetas, por favor pida a su farmacia que se ponga en contacto con nuestra oficina. Nuestro nmero de fax es el [phone].  Si tiene un asunto urgente cuando la clnica est cerrada y que no puede esperar hasta el siguiente da hbil, puede llamar/localizar a su doctor(a) al nmero que aparece a continuacin.   Por favor, tenga en cuenta que aunque hacemos todo lo posible para estar disponibles para asuntos urgentes fuera del horario de oficina, no estamos disponibles las 24 horas del da, los 7 das de la semana.   Si tiene un problema urgente y no puede comunicarse con nosotros, puede optar por buscar atencin mdica  en el consultorio de su doctor(a), en una clnica privada, en un centro de atencin urgente o en una sala de emergencias.  Si tiene una emergencia mdica, por favor llame inmediatamente al 911 o vaya a la sala de emergencias.  Nmeros de bper  - Dr. Kowalski: [phone]  - Dra. Moye: [phone]  - Dra. Stewart: [phone]  En caso de inclemencias del tiempo, por favor llame a nuestra lnea principal al [phone] para una actualizacin sobre el estado de cualquier retraso o cierre.  Consejos para la medicacin en dermatologa: Por favor, guarde las cajas en las que vienen los medicamentos de uso tpico para ayudarle a seguir las instrucciones sobre dnde y cmo usarlos. Las farmacias generalmente imprimen las instrucciones del medicamento slo en las cajas y no directamente en los tubos del medicamento.   Si su medicamento es muy caro, por favor, pngase en contacto con nuestra oficina llamando al [phone] y presione la opcin 4 o envenos un mensaje a travs de MyChart.   No podemos decirle cul ser su copago por los medicamentos por adelantado ya que esto es diferente dependiendo de la cobertura de su seguro. Sin embargo, es posible que podamos encontrar un medicamento sustituto a menor costo o llenar un formulario para que el  seguro cubra el medicamento que se considera necesario.   Si se requiere una autorizacin previa para que su compaa de seguros cubra su medicamento, por favor permtanos de 1 a 2 das hbiles para completar este proceso.  Los precios de los medicamentos varan con frecuencia dependiendo del lugar de dnde se surte la receta y alguna farmacias pueden ofrecer precios ms baratos.  El sitio web www.goodrx.com tiene cupones para medicamentos de diferentes farmacias. Los precios aqu no tienen en cuenta lo que podra costar con la ayuda del seguro (puede ser ms barato con su seguro), pero el sitio web puede darle el precio si no utiliz ningn seguro.  - Puede imprimir el cupn correspondiente y llevarlo con su receta a la farmacia.  - Tambin puede pasar por nuestra oficina durante el horario de atencin regular y recoger una tarjeta de cupones de GoodRx.  - Si necesita que su receta se enve electrnicamente a una farmacia diferente, informe a nuestra oficina a travs de MyChart de [place]   o por telfono llamando al [phone] y presione la opcin 4.  

## 2022-11-07 NOTE — Progress Notes (Signed)
   Follow-Up Visit   Subjective  Alyssa Humphrey is a 57 y.o. female who presents for the following: 6 month follow-up AK of the left nasal dorsum and recheck nevus of the right upper inner arm.    The following portions of the chart were reviewed this encounter and updated as appropriate: medications, allergies, medical history  Review of Systems:  No other skin or systemic complaints except as noted in HPI or Assessment and Plan.  Objective  Well appearing patient in no apparent distress; mood and affect are within normal limits.  A focused examination was performed of the following areas: Face, arms Relevant physical exam findings are noted in the Assessment and Plan.    Assessment & Plan   ACTINIC DAMAGE - chronic, secondary to cumulative UV radiation exposure/sun exposure over time - diffuse scaly erythematous macules with underlying dyspigmentation - Recommend daily broad spectrum sunscreen SPF 30+ to sun-exposed areas, reapply every 2 hours as needed.  - Recommend staying in the shade or wearing long sleeves, sun glasses (UVA+UVB protection) and wide brim hats (4-inch brim around the entire circumference of the hat). - Call for new or changing lesions.  HISTORY OF PRECANCEROUS ACTINIC KERATOSIS - site of PreCancerous Actinic Keratosis clear today, left nasal dorsum - these may recur and new lesions may form requiring treatment to prevent transformation into skin cancer - observe for new or changing spots and contact [facility] for appointment if occur - photoprotection with sun protective clothing; sunglasses and broad spectrum sunscreen with SPF of at least 30 + and frequent self skin exams recommended - yearly exams by a dermatologist recommended for persons with history of PreCancerous Actinic Keratoses  LENTIGINES Exam: scattered tan macules Due to sun exposure Treatment Plan: Benign-appearing, observe. Recommend daily broad spectrum sunscreen SPF 30+ to  sun-exposed areas, reapply every 2 hours as needed.  Call for any changes  MELANOCYTIC NEVI Exam: Tan-brown and/or pink-flesh-colored symmetric macules and papules  right upper inner arm 1.5 mm dark brown macule, stable compared to photo 03/14/22   Treatment Plan: Benign appearing on exam today. Recommend observation. Call clinic for new or changing moles. Recommend daily use of broad spectrum spf 30+ sunscreen to sun-exposed areas.    Return if symptoms worsen or fail to improve.  ICherlyn Labella, CMA, am acting as scribe for Willeen Niece, MD .   Documentation: I have reviewed the above documentation for accuracy and completeness, and I agree with the above.  Willeen Niece, MD

## 2022-11-20 ENCOUNTER — Ambulatory Visit (INDEPENDENT_AMBULATORY_CARE_PROVIDER_SITE_OTHER): Payer: 59 | Admitting: Family Medicine

## 2022-11-20 ENCOUNTER — Encounter: Payer: Self-pay | Admitting: Family Medicine

## 2022-11-20 VITALS — BP 109/47 | HR 60 | Temp 97.8°F | Ht 64.0 in | Wt 120.9 lb

## 2022-11-20 DIAGNOSIS — Z8249 Family history of ischemic heart disease and other diseases of the circulatory system: Secondary | ICD-10-CM | POA: Diagnosis not present

## 2022-11-20 DIAGNOSIS — K219 Gastro-esophageal reflux disease without esophagitis: Secondary | ICD-10-CM

## 2022-11-20 DIAGNOSIS — F419 Anxiety disorder, unspecified: Secondary | ICD-10-CM

## 2022-11-20 DIAGNOSIS — K589 Irritable bowel syndrome without diarrhea: Secondary | ICD-10-CM

## 2022-11-20 DIAGNOSIS — Z0000 Encounter for general adult medical examination without abnormal findings: Secondary | ICD-10-CM

## 2022-11-20 NOTE — Progress Notes (Signed)
Complete physical exam   Patient: Alyssa Humphrey   DOB: [birth year]   57 y.o. Female  MRN: [id number] Visit Date: 11/20/2022  Today's healthcare provider: Mila Merry, MD   Chief Complaint  Patient presents with   Annual Exam   Subjective    Alyssa Humphrey is a 57 y.o. female who presents today for a complete physical exam.  She reports consuming a  healthy  diet.  She exercises regular with trainer  She generally feels well. She has full body skin exams yearly by Dr. Roseanne Reno. She has complete [facility] exam yearly at [facility]. She has history of GERD for which she is prescribed omeprazole. She reports reflux sx are very well controlled with diet and rarely needs to take omeprazole. She also takes Librax prn for IBS which she typically needs to take a few times per week depending on diet. She also continues on sertraline for  anxiety which remains very effective and she feels she needs to continue at current dose.    Past Medical History:  Diagnosis Date   Acid reflux 03/15/2015   Adaptation reaction 03/15/2015   Dermatitis, eczematoid 03/15/2015   hand    Dysplastic nevus 10/01/2007   R distal tricep near elbow, slight to mod   Dysplastic nevus 10/01/2007   R distal lat thigh, slight to mod   Family history of Alzheimer's disease 03/15/2015   Impingement syndrome of shoulder 08/11/2002   Injected per Ortho    Patella-femoral syndrome 04/25/2010   treated by Dr Erby Pian    Post menopausal syndrome 03/15/2015   Skin lesion 09/28/2003   Lesion on right chin removed by Dr Wyn Forster Clarcke-Compound nevus    Stress incontinence 01/05/2009   Treated by Dr. Imelda Pillow    Past Surgical History:  Procedure Laterality Date   ABDOMINAL HYSTERECTOMY  2007   CESAREAN SECTION  04/26/92; 01/15/96; 12/18/99   COLONOSCOPY WITH PROPOFOL N/A 12/28/2020   Procedure: COLONOSCOPY WITH PROPOFOL;  Surgeon: Midge Minium, MD;  Location: [facility] ENDOSCOPY;  Service: Endoscopy;  Laterality: N/A;    DILATION AND CURETTAGE OF UTERUS     x's 2   ESOPHAGOGASTRODUODENOSCOPY N/A 12/28/2020   Procedure: ESOPHAGOGASTRODUODENOSCOPY (EGD);  Surgeon: Midge Minium, MD;  Location: [facility] ENDOSCOPY;  Service: Endoscopy;  Laterality: N/A;   KNEE SURGERY Left    NASAL SINUS SURGERY     Social History   Socioeconomic History   Marital status: Married    Spouse name: Not on file   Number of children: Not on file   Years of education: Not on file   Highest education level: Not on file  Occupational History   Not on file  Tobacco Use   Smoking status: Never   Smokeless tobacco: Never  Vaping Use   Vaping status: Never Used  Substance and Sexual Activity   Alcohol use: Yes    Alcohol/week: 2.0 standard drinks of alcohol    Types: 2 Glasses of wine per week   Drug use: No   Sexual activity: Yes    Birth control/protection: Surgical  Other Topics Concern   Not on file  Social History Narrative   Not on file   Social Determinants of Health   Financial Resource Strain: Not on file  Food Insecurity: Not on file  Transportation Needs: Not on file  Physical Activity: Not on file  Stress: Not on file  Social Connections: Not on file  Intimate Partner Violence: Not on file   Family  Status  Relation Name Status   Mother  Alive   Father  Deceased at age 67   Sister  Alive   Sister  Alive   Neg Hx  (Not Specified)  No partnership data on file   Family History  Problem Relation Age of Onset   Dementia Mother    Osteoarthritis Mother    Hypertension Mother    Hypertension Father    Osteoarthritis Father    Healthy Sister    Healthy Sister    Kidney cancer Neg Hx    Breast cancer Neg Hx    No Known Allergies  Patient Care Team: Malva Limes, MD as PCP - General (Family Medicine) Isla Pence, OD (Optometry)   Medications: Outpatient Medications Prior to Visit  Medication Sig   clidinium-chlordiazePOXIDE (LIBRAX) 5-2.5 MG capsule TAKE 1 CAPSULE BY MOUTH 2 TIMES DAILY  AS NEEDED.   estradiol (ESTRACE) 1 MG tablet Take 1 tablet (1 mg total) by mouth daily. Take in place of Premarin due to formulary coverage   omeprazole (PRILOSEC) 20 MG capsule Take 1 capsule (20 mg total) by mouth daily.   sertraline (ZOLOFT) 100 MG tablet TAKE 1 TABLET BY MOUTH DAILY. MUST BE MANUFACTURED BY [employer]. PATIENT DOES NOT TOLERATE MEDICATION BY OTHER MANUFACTURERS.   No facility-administered medications prior to visit.       Objective    BP (!) 109/47 (BP Location: Left Arm, Patient Position: Sitting, Cuff Size: Normal)   Pulse 60   Temp 97.8 F (36.6 C) (Temporal)   Ht 5\' 4"  (1.626 m)   Wt 120 lb 14.4 oz (54.8 kg)   HC 12" (30.5 cm)   SpO2 98%   BMI 20.75 kg/m    Physical Exam   General Appearance:    Well developed, well nourished female. Alert, cooperative, in no acute distress, appears stated age   Head:    Normocephalic, without obvious abnormality, atraumatic  Eyes:    PERRL, conjunctiva/corneas clear, EOM's intact, fundi    benign, both eyes  Ears:    Normal TM's and external ear canals, both ears  Nose:   Nares normal, septum midline, mucosa normal, no drainage    or sinus tenderness  Throat:   Lips, mucosa, and tongue normal; teeth and gums normal  Neck:   Supple, symmetrical, trachea midline, no adenopathy;    thyroid:  no enlargement/tenderness/nodules; no carotid   bruit or JVD  Back:     Symmetric, no curvature, ROM normal, no CVA tenderness  Lungs:     Clear to auscultation bilaterally, respirations unlabored  Chest Wall:    No tenderness or deformity   Heart:    Normal heart rate. Normal rhythm. No murmurs, rubs, or gallops.   Breast Exam:    deferred  Abdomen:     Soft, non-tender, bowel sounds active all four quadrants,    no masses, no organomegaly  Pelvic:    deferred  Extremities:   All extremities are intact. No cyanosis or edema  Pulses:   2+ and symmetric all extremities  Skin:   Skin color, texture, turgor normal, no rashes or  lesions  Lymph nodes:   Cervical, supraclavicular, and axillary nodes normal  Neurologic:   CNII-XII intact, normal strength, sensation and reflexes    throughout     Last depression screening scores    11/20/2022    3:11 PM 11/18/2021    2:38 PM 10/17/2021   10:32 AM  PHQ 2/9 Scores  PHQ - 2  Score 0 0 0  PHQ- 9 Score  0 1   Last fall risk screening    11/20/2022    3:11 PM  Fall Risk   Falls in the past year? 0  Number falls in past yr: 0  Injury with Fall? 0  Risk for fall due to : No Fall Risks  Follow up Falls evaluation completed   Last Audit-C alcohol use screening    11/18/2021    2:37 PM  Alcohol Use Disorder Test (AUDIT)  1. How often do you have a drink containing alcohol? 3  2. How many drinks containing alcohol do you have on a typical day when you are drinking? 0  3. How often do you have six or more drinks on one occasion? 0  AUDIT-C Score 3   A score of 3 or more in women, and 4 or more in men indicates increased risk for alcohol abuse, EXCEPT if all of the points are from question 1   No results found for any visits on 11/20/22.  Assessment & Plan    Routine Health Maintenance and Physical Exam  Exercise Activities and Dietary recommendations  Goals      Exercise 150 minutes per week (moderate activity)        Immunization History  Administered Date(s) Administered   Influenza,inj,Quad PF,6+ Mos 04/23/2015, 05/27/2016, 02/18/2018, 02/18/2019   Influenza-Unspecified 04/27/2011   PFIZER(Purple Top)SARS-COV-2 Vaccination 07/04/2019, 07/25/2019, 04/14/2020   Tdap 10/18/2020   Zoster Recombinant(Shingrix) 10/18/2020, 11/18/2021    Health Maintenance  Topic Date Due   HIV Screening  Never done   COVID-19 Vaccine (4 - 2023-24 season) 01/06/2022   INFLUENZA VACCINE  12/07/2022   MAMMOGRAM  01/31/2023   PAP SMEAR-Modifier  11/19/2026   DTaP/Tdap/Td (2 - Td or Tdap) 10/19/2030   Colonoscopy  12/29/2030   Hepatitis C Screening  Completed    Zoster Vaccines- Shingrix  Completed   HPV VACCINES  Aged Out    Discussed health benefits of physical activity, and encouraged her to engage in regular exercise appropriate for her age and condition.  1. Annual physical exam  - CBC - Comprehensive metabolic panel - Lipid panel  2. Family history of coronary artery bypass graft Patient low ASCVD risk score, but she reports father MI/CABG when she was in high school.   - Lipoprotein A (LPA) - Apolipoprotein B  3. Irritable bowel syndrome, unspecified type Well managed with diet and occasional use of Librax.   4. Gastroesophageal reflux disease without esophagitis Well managed with diet and rare use of PPI  5. Anxiety Doing very well on long term sertraline.        Mila Merry, MD  [facility] [phone] (phone) [phone] (fax)  [facility]

## 2022-11-20 NOTE — Patient Instructions (Signed)
 .   Please review the attached list of medications and notify my office if there are any errors.   . Please bring all of your medications to every appointment so we can make sure that our medication list is the same as yours.   

## 2022-11-29 LAB — COMPREHENSIVE METABOLIC PANEL
ALT: 13 IU/L (ref 0–32)
AST: 20 IU/L (ref 0–40)
Albumin: 4.5 g/dL (ref 3.8–4.9)
Alkaline Phosphatase: 61 IU/L (ref 44–121)
BUN/Creatinine Ratio: 16 (ref 9–23)
BUN: 14 mg/dL (ref 6–24)
Bilirubin Total: 0.3 mg/dL (ref 0.0–1.2)
CO2: 22 mmol/L (ref 20–29)
Calcium: 9.6 mg/dL (ref 8.7–10.2)
Chloride: 102 mmol/L (ref 96–106)
Creatinine, Ser: 0.88 mg/dL (ref 0.57–1.00)
Globulin, Total: 2.5 g/dL (ref 1.5–4.5)
Glucose: 95 mg/dL (ref 70–99)
Potassium: 4.4 mmol/L (ref 3.5–5.2)
Sodium: 141 mmol/L (ref 134–144)
Total Protein: 7 g/dL (ref 6.0–8.5)
eGFR: 76 mL/min/{1.73_m2} (ref >59)

## 2022-11-29 LAB — LIPID PANEL
Chol/HDL Ratio: 3.3 ratio (ref 0.0–4.4)
Cholesterol, Total: 231 mg/dL — ABNORMAL HIGH (ref 100–199)
HDL: 71 mg/dL (ref >39)
LDL Chol Calc (NIH): 140 mg/dL — ABNORMAL HIGH (ref 0–99)
Triglycerides: 114 mg/dL (ref 0–149)
VLDL Cholesterol Cal: 20 mg/dL (ref 5–40)

## 2022-11-29 LAB — CBC
Hematocrit: 40.3 % (ref 34.0–46.6)
Hemoglobin: 12.6 g/dL (ref 11.1–15.9)
MCH: 29.4 pg (ref 26.6–33.0)
MCHC: 31.3 g/dL — ABNORMAL LOW (ref 31.5–35.7)
MCV: 94 fL (ref 79–97)
Platelets: 233 10*3/uL (ref 150–450)
RBC: 4.29 x10E6/uL (ref 3.77–5.28)
RDW: 13.4 % (ref 11.7–15.4)
WBC: 5 10*3/uL (ref 3.4–10.8)

## 2022-11-29 LAB — APOLIPOPROTEIN B: Apolipoprotein B: 104 mg/dL — ABNORMAL HIGH (ref <90)

## 2022-11-29 LAB — LIPOPROTEIN A (LPA): Lipoprotein (a): 44.9 nmol/L (ref <75.0)

## 2022-11-30 ENCOUNTER — Encounter: Payer: Self-pay | Admitting: Family Medicine

## 2022-11-30 DIAGNOSIS — E7800 Pure hypercholesterolemia, unspecified: Secondary | ICD-10-CM | POA: Insufficient documentation

## 2023-03-01 ENCOUNTER — Other Ambulatory Visit: Payer: Self-pay | Admitting: Family Medicine

## 2023-03-02 ENCOUNTER — Other Ambulatory Visit: Payer: Self-pay | Admitting: Family Medicine

## 2023-03-02 DIAGNOSIS — Z1231 Encounter for screening mammogram for malignant neoplasm of breast: Secondary | ICD-10-CM

## 2023-03-14 ENCOUNTER — Ambulatory Visit
Admission: RE | Admit: 2023-03-14 | Discharge: 2023-03-14 | Disposition: A | Discharge status: Home / Self Care | Payer: 59 | Source: Ambulatory Visit | Attending: Family Medicine | Admitting: Family Medicine

## 2023-03-14 DIAGNOSIS — Z1231 Encounter for screening mammogram for malignant neoplasm of breast: Secondary | ICD-10-CM | POA: Insufficient documentation

## 2023-03-19 ENCOUNTER — Other Ambulatory Visit: Payer: Self-pay | Admitting: Family Medicine

## 2023-03-19 DIAGNOSIS — R928 Other abnormal and inconclusive findings on diagnostic imaging of breast: Secondary | ICD-10-CM

## 2023-03-22 ENCOUNTER — Ambulatory Visit
Admission: RE | Admit: 2023-03-22 | Discharge: 2023-03-22 | Disposition: A | Discharge status: Home / Self Care | Payer: 59 | Source: Ambulatory Visit | Attending: Family Medicine | Admitting: Family Medicine

## 2023-03-22 DIAGNOSIS — R928 Other abnormal and inconclusive findings on diagnostic imaging of breast: Secondary | ICD-10-CM

## 2023-03-27 ENCOUNTER — Other Ambulatory Visit: Payer: 59

## 2024-04-21 ENCOUNTER — Other Ambulatory Visit: Payer: Self-pay | Admitting: Internal Medicine

## 2024-04-21 DIAGNOSIS — Z1231 Encounter for screening mammogram for malignant neoplasm of breast: Secondary | ICD-10-CM

## 2024-05-14 ENCOUNTER — Other Ambulatory Visit: Payer: Self-pay | Admitting: Medical Genetics

## 2024-05-16 ENCOUNTER — Other Ambulatory Visit
Admission: RE | Admit: 2024-05-16 | Discharge: 2024-05-16 | Disposition: A | Payer: Self-pay | Source: Ambulatory Visit | Attending: Medical Genetics | Admitting: Medical Genetics

## 2024-05-21 ENCOUNTER — Ambulatory Visit
Admission: RE | Admit: 2024-05-21 | Discharge: 2024-05-21 | Disposition: A | Discharge status: Home / Self Care | Source: Ambulatory Visit | Attending: Internal Medicine | Admitting: Internal Medicine

## 2024-05-21 DIAGNOSIS — Z1231 Encounter for screening mammogram for malignant neoplasm of breast: Secondary | ICD-10-CM | POA: Insufficient documentation
